# Patient Record
Sex: Female | Born: 1950 | Race: White | Hispanic: No | Marital: Married | Smoking: Never smoker
Health system: Southern US, Community
[De-identification: ages and names within clinical notes are randomized; demographics above are authoritative.]

## PROBLEM LIST (undated history)

## (undated) DIAGNOSIS — F419 Anxiety disorder, unspecified: Secondary | ICD-10-CM

## (undated) DIAGNOSIS — M1990 Unspecified osteoarthritis, unspecified site: Secondary | ICD-10-CM

## (undated) DIAGNOSIS — T8859XA Other complications of anesthesia, initial encounter: Secondary | ICD-10-CM

## (undated) HISTORY — PX: COLONOSCOPY: SHX174

## (undated) HISTORY — PX: CARPAL TUNNEL RELEASE: SHX101

---

## 2002-08-18 ENCOUNTER — Ambulatory Visit (HOSPITAL_COMMUNITY): Admission: RE | Admit: 2002-08-18 | Discharge: 2002-08-18 | Payer: Self-pay | Admitting: Otolaryngology

## 2002-08-18 ENCOUNTER — Encounter: Payer: Self-pay | Admitting: Otolaryngology

## 2004-05-30 ENCOUNTER — Ambulatory Visit (HOSPITAL_COMMUNITY): Admission: RE | Admit: 2004-05-30 | Discharge: 2004-05-30 | Payer: Self-pay | Admitting: General Surgery

## 2004-05-30 ENCOUNTER — Encounter (INDEPENDENT_AMBULATORY_CARE_PROVIDER_SITE_OTHER): Payer: Self-pay | Admitting: *Deleted

## 2004-05-30 ENCOUNTER — Ambulatory Visit (HOSPITAL_BASED_OUTPATIENT_CLINIC_OR_DEPARTMENT_OTHER): Admission: RE | Admit: 2004-05-30 | Discharge: 2004-05-30 | Payer: Self-pay | Admitting: General Surgery

## 2006-06-29 HISTORY — PX: THYROID SURGERY: SHX805

## 2006-11-05 ENCOUNTER — Ambulatory Visit (HOSPITAL_COMMUNITY): Admission: RE | Admit: 2006-11-05 | Discharge: 2006-11-05 | Payer: Self-pay | Admitting: Otolaryngology

## 2006-11-25 ENCOUNTER — Other Ambulatory Visit: Admission: RE | Admit: 2006-11-25 | Discharge: 2006-11-25 | Payer: Self-pay | Admitting: Otolaryngology

## 2007-01-07 ENCOUNTER — Encounter (INDEPENDENT_AMBULATORY_CARE_PROVIDER_SITE_OTHER): Payer: Self-pay | Admitting: Otolaryngology

## 2007-01-07 ENCOUNTER — Ambulatory Visit (HOSPITAL_COMMUNITY): Admission: RE | Admit: 2007-01-07 | Discharge: 2007-01-08 | Payer: Self-pay | Admitting: Otolaryngology

## 2008-06-18 ENCOUNTER — Ambulatory Visit (HOSPITAL_COMMUNITY): Admission: RE | Admit: 2008-06-18 | Discharge: 2008-06-18 | Payer: Self-pay | Admitting: Otolaryngology

## 2010-11-11 NOTE — Op Note (Signed)
NAME:  Dana Owens, Dana Owens               ACCOUNT NO.:  [id number]   MEDICAL RECORD NO.:  [id number]          PATIENT TYPE:  OIB   LOCATION:  5733                         FACILITY:  [facility]   PHYSICIAN:  Jefry H. Pollyann Kennedy, MD     DATE OF BIRTH:  [birth year]   DATE OF PROCEDURE:  01/07/2007  DATE OF DISCHARGE:                               OPERATIVE REPORT   PREOPERATIVE DIAGNOSIS:  Left thyroid mass.   POSTOPERATIVE DIAGNOSIS:  Left thyroid mass.   PROCEDURE:  Left thyroid lobectomy.   SURGEON:  Jefry H. Pollyann Kennedy, MD   ASSISTANT:  Kinnie Scales. Annalee Genta, M.D.   General endotracheal anesthesia was used.   No complications.   No blood loss.   FINDINGS:  Diffusely firm and somewhat sticky thyroid gland with  multiple nodules.  The frozen section evaluation revealed no evidence of  carcinoma.   HISTORY:  This is a 60 year old lady who has a history of thyroiditis  and has had a thyroid nodule on the left side that has increased in size  over the past couple of years.  Risks, benefits, alternatives,  complications of the procedure were explained to the patient who seemed  to understand and agreed for surgery.   OPERATIVE PROCEDURE:  The patient was taken to the operating room and  placed on the operating room table in the supine position.  Following  induction of general endotracheal anesthesia, the neck was prepped and  draped in a standard fashion.  A low collar incision was outlined with a  marking pen.  Electrocautery was used to incise through the skin,  subcutaneous tissue and superficial fascia.  A subplatysmal flap was  developed superiorly to the thyroid notch and inferiorly to the  clavicle.  Self-retaining thyroid retractor was used.  The midline  fascia was divided and the strap muscles were reflected laterally off  the left lobe of the thyroid.  The dissection was continued along the  capsule of the thyroid.  Parathyroid glands were not separately  identified.  The recurrent nerve  was identified and was preserved.  The  gland was somewhat adherent to all surrounding tissues.  The superior  and inferior vasculature was ligated between clamps and divided.  Berry  ligament was divided using electrocautery.  The thyroid isthmus was then  divided with cautery as well.  The specimen was sent for pathologic  evaluation.  The surgical bed was irrigated with saline and hemostasis  was completed using silk ties and bipolar cautery as needed.  The wound  was closed in layers using 3-0 chromic in the midline fascia in the  platysma and the subcuticular layer.  Dermabond was used on the skin.  A 7-French round drain was left in the  wound, navigated through the right side of the incision and secured in  place with a nylon suture.  The patient was then awakened, extubated and  transferred to recovery in stable condition.      Jefry H. Pollyann Kennedy, MD  Electronically Signed     JHR/MEDQ  D:  01/07/2007  T:  01/07/2007  Job:  [id number]

## 2010-11-14 NOTE — Op Note (Signed)
NAMEMELICIA, ESQUEDA               ACCOUNT NO.:  [id number]   MEDICAL RECORD NO.:  [id number]          PATIENT TYPE:  AMB   LOCATION:  DSC                          FACILITY:  [facility]   PHYSICIAN:  Adolph Pollack, M.D.DATE OF BIRTH:  May 01, 1951   DATE OF PROCEDURE:  05/30/2004  DATE OF DISCHARGE:                                 OPERATIVE REPORT   PREOPERATIVE DIAGNOSIS:  Inflamed epidermoid cyst to right shoulder.   POSTOPERATIVE DIAGNOSIS:  Inflamed epidermoid cyst to right shoulder.   PROCEDURE:  Excision of 2-cm inflamed epidermoid cyst right shoulder with  primary closure of wound.   SURGEON:  Adolph Pollack, M.D.   ANESTHESIA:  Local (1% lidocaine plus 0.5% Marcaine).   INDICATION:  This is a 60 year old female with an infected epidermoid cyst.  She had drainage and debridement in my office.  She has had some residual  cyst material come back and it appears to be inflamed.  She now presents for  excision.   TECHNIQUE:  She is placed supine on the table in the minor procedure room.  The right anterior shoulder was examined and the area marked with marking  pen to be excised.  It was then sterilely prepped and draped.  Local  anesthetic was infiltrated superficially and deep with an elliptical  fashion.  An elliptical incision was made full-thickness skin and including  some subcutaneous tissue down to soft tissue, then the mass was excised  sharply and sent to pathology.   Bleeding was controlled with cautery.  Once hemostasis was adequate, the  wound was closed primarily with interrupted simple 3-0 Nylon sutures.  Neosporin and a dressing were applied.   She tolerated the procedure well without any apparent complications.  She  was released from the minor procedure room in satisfactory condition.  She  was given discharge instructions and will come back and see me in 10-14 days  or sooner if needed.      Tish Men  D:  05/30/2004  T:  05/30/2004  Job:   [id number]

## 2011-02-06 ENCOUNTER — Other Ambulatory Visit: Payer: Self-pay | Admitting: Obstetrics & Gynecology

## 2011-04-14 LAB — BASIC METABOLIC PANEL
BUN: 11
CO2: 29
Calcium: 9.5
Chloride: 104
Creatinine, Ser: 0.78
GFR calc Af Amer: >60
GFR calc non Af Amer: >60
Glucose, Bld: 93
Potassium: 4.4
Sodium: 140

## 2011-04-14 LAB — CBC
RBC: 4.5
WBC: 5.9

## 2011-12-08 ENCOUNTER — Other Ambulatory Visit (HOSPITAL_COMMUNITY): Payer: Self-pay | Admitting: Preventative Medicine

## 2011-12-08 DIAGNOSIS — R9389 Abnormal findings on diagnostic imaging of other specified body structures: Secondary | ICD-10-CM

## 2011-12-11 ENCOUNTER — Ambulatory Visit (HOSPITAL_COMMUNITY)
Admission: RE | Admit: 2011-12-11 | Discharge: 2011-12-11 | Disposition: A | Discharge status: Home / Self Care | Payer: BC Managed Care – PPO | Source: Ambulatory Visit | Attending: Preventative Medicine | Admitting: Preventative Medicine

## 2011-12-11 DIAGNOSIS — R918 Other nonspecific abnormal finding of lung field: Secondary | ICD-10-CM | POA: Insufficient documentation

## 2011-12-11 DIAGNOSIS — R9389 Abnormal findings on diagnostic imaging of other specified body structures: Secondary | ICD-10-CM

## 2011-12-11 DIAGNOSIS — R05 Cough: Secondary | ICD-10-CM | POA: Insufficient documentation

## 2011-12-11 DIAGNOSIS — R059 Cough, unspecified: Secondary | ICD-10-CM | POA: Insufficient documentation

## 2013-05-08 IMAGING — CT CT CHEST W/O CM
2 of 3 series · 15 of 36 positions shown, 18 images · non-contrast
Comparison: Chest radiograph 12/07/2011

CLINICAL DATA: Cough, abnormal chest x-ray, history of thyroid
surgery

CT CHEST WITHOUT CONTRAST
TECHNIQUE: Multidetector CT imaging of the chest was performed
following the standard protocol without IV contrast. Sagittal and
coronal MPR images reconstructed from axial data set.

[Series 2: chestroutine 5.0 b40f · axial · 0.66mm/px · z∈[-275,-25]mm · 12 of 60 slices shown, 15 images]
[im 5/60  mediastinal]
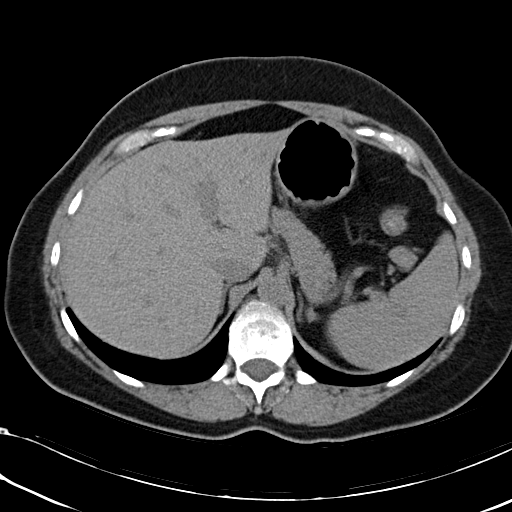
[im 5/60  lung]
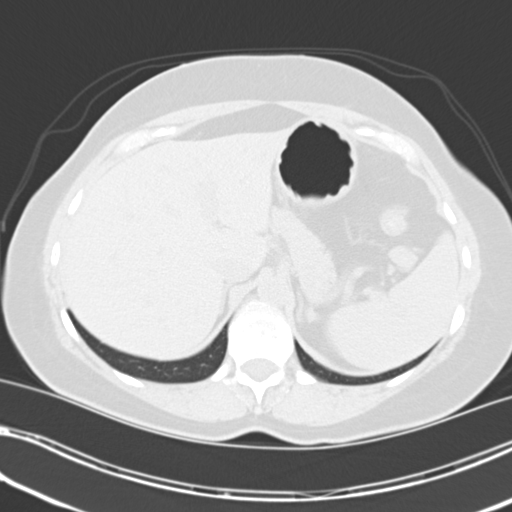
[im 9/60  lung]
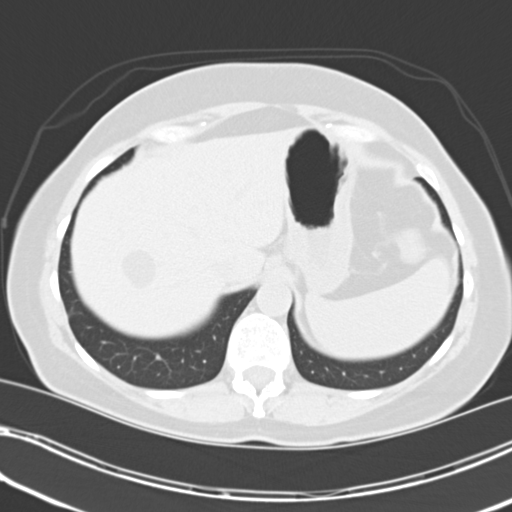
[im 14/60  lung]
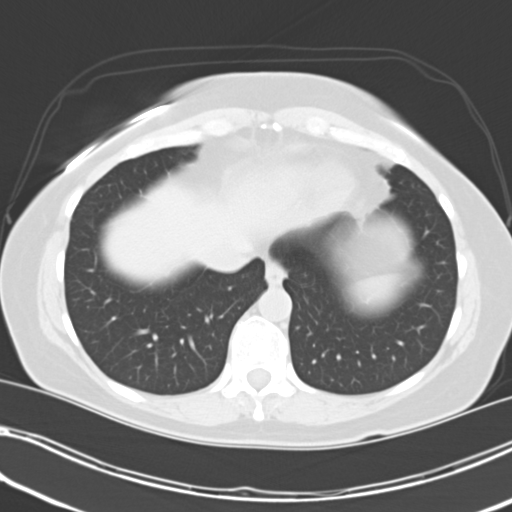
[im 18/60  lung]
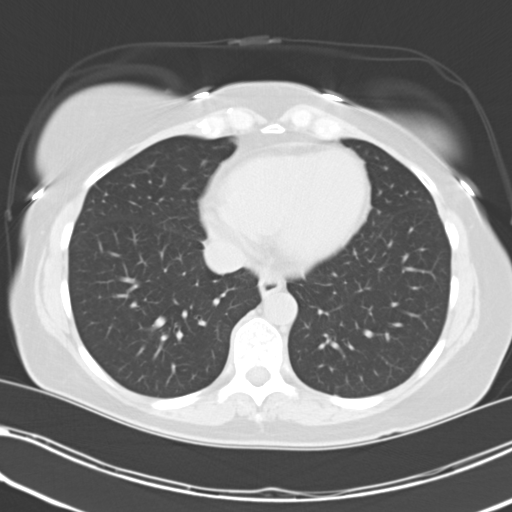
[im 22/60  mediastinal]
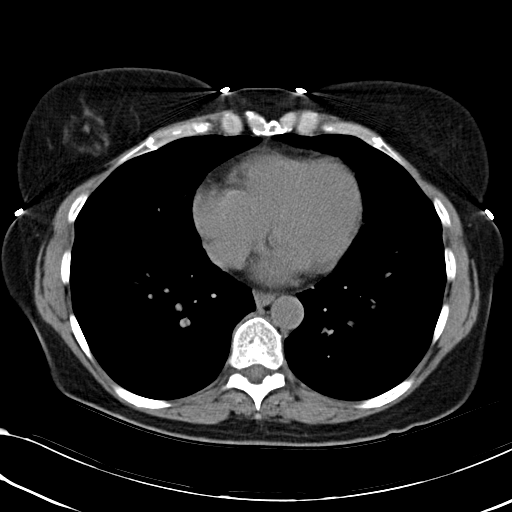
[im 22/60  lung]
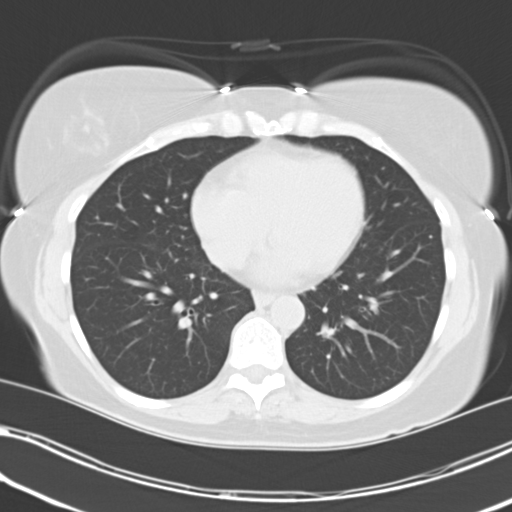
[im 27/60  lung]
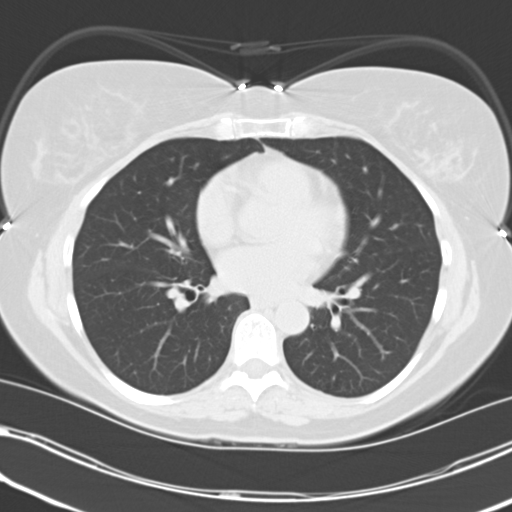
[im 33/60  lung]
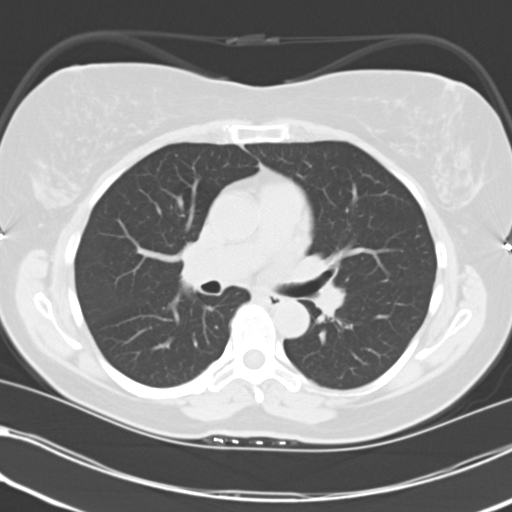
[im 38/60  lung]
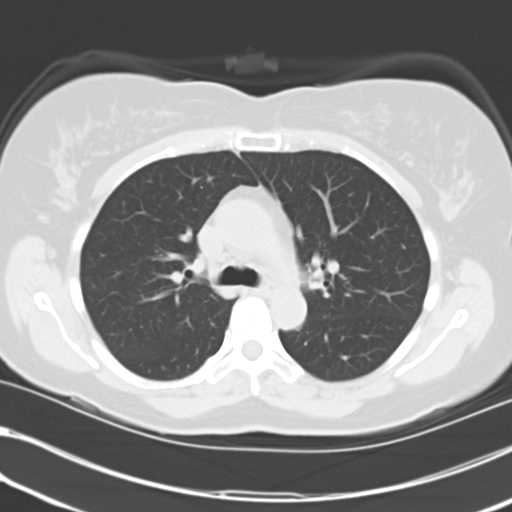
[im 42/60  mediastinal]
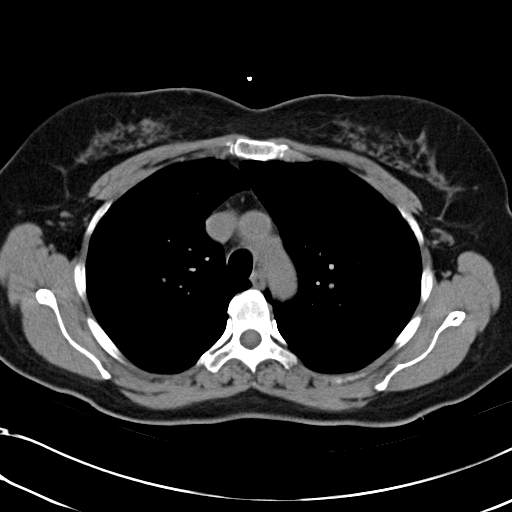
[im 42/60  lung]
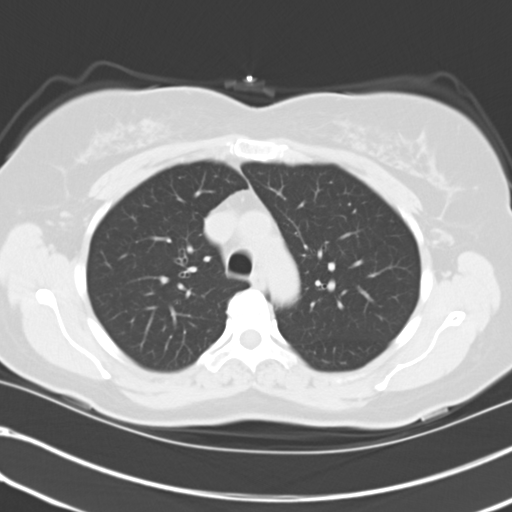
[im 46/60  lung]
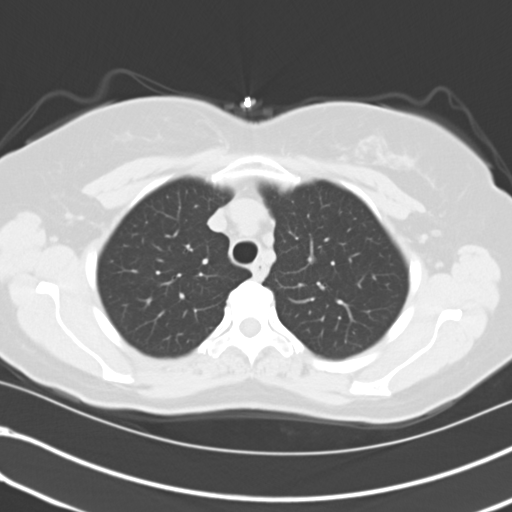
[im 51/60  lung]
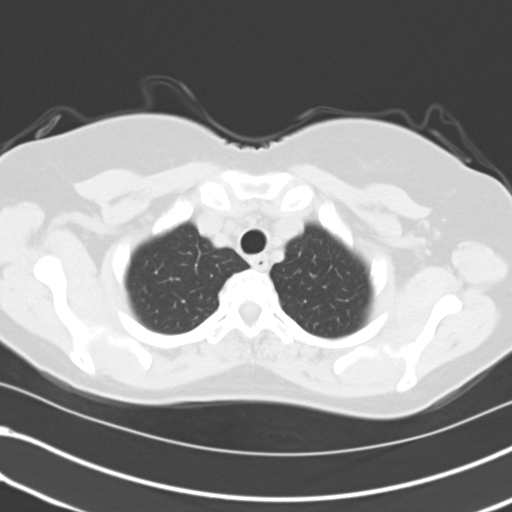
[im 55/60  lung]
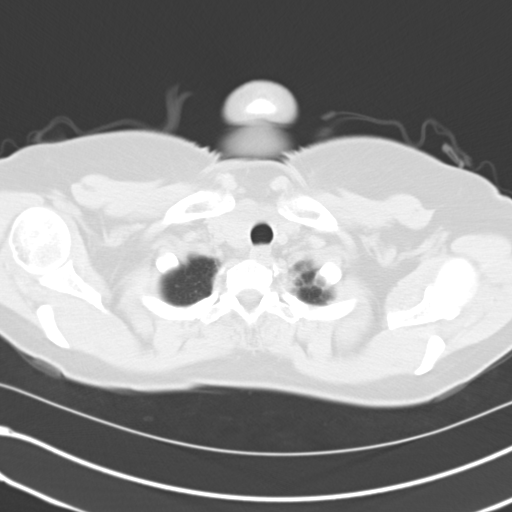

[Series 4: mpr coro 3mm · coronal · 0.60mm/px · 3 of 78 slices shown]
[im 16/78  lung]
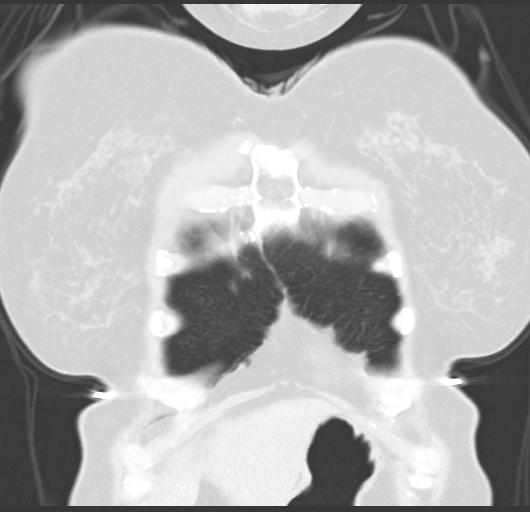
[im 31/78  lung]
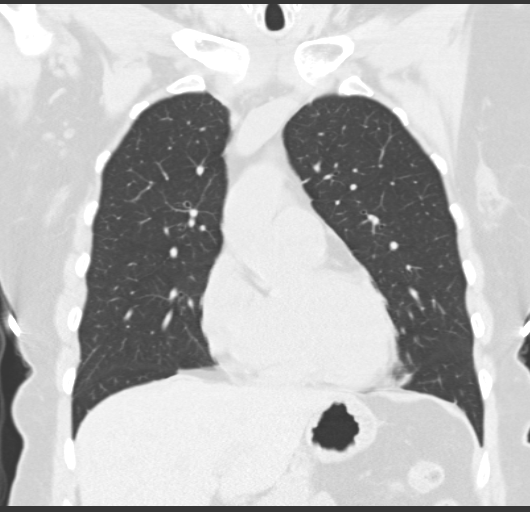
[im 47/78  lung]
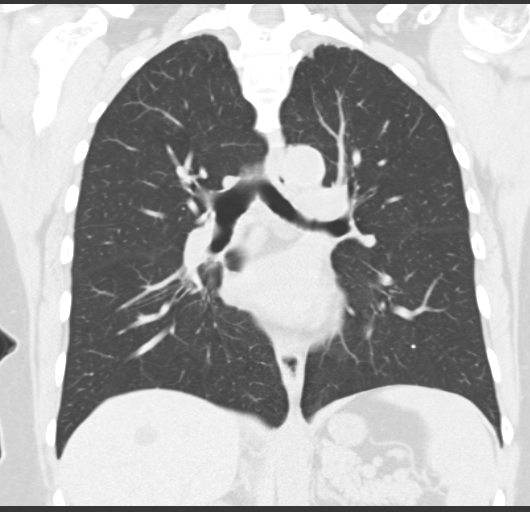

[15 of 36 positions shown; findings below may reference images not displayed]

FINDINGS: Aorta normal caliber.
No thoracic adenopathy.
Hepatic cysts, largest 2.7 x 2.3 cm.
Remaining visualized portions of upper abdomen normal appearance.
Mildly prominent epicardial fat pad at left cardiophrenic angle /
cardiac apex, accounting for chest radiograph finding.
Tiny right upper lobe nodules images 24-25.
Calcified granulomata left lung.
No dominant mass or nodule.
No infiltrate, pleural effusion or pneumothorax.
Irregular vertebral endplates throughout the thoracic spine with a
few scattered nonspecific vertebral sclerotic foci, could represent
small bone islands.
IMPRESSION: Multiple calcified granulomata in left lung with additional tiny
nodular foci in right lung which are not definitely calcified,
recommendations below.
Prominent fat pad adjacent to the cardiac apex accounting for the
radiographic abnormality.
No evidence of dominant pulmonary mass or nodule.

If the patient is at high risk for bronchogenic carcinoma, follow-
up chest CT at 1 year is recommended.  If the patient is at low
risk, no follow-up is needed.  This recommendation follows the
consensus statement: Guidelines for Management of Small Pulmonary
Nodules Detected on CT Scans:  A Statement from the [employer]

## 2013-06-12 ENCOUNTER — Other Ambulatory Visit: Payer: Self-pay | Admitting: Obstetrics & Gynecology

## 2014-11-05 ENCOUNTER — Other Ambulatory Visit (HOSPITAL_COMMUNITY): Payer: Self-pay | Admitting: Obstetrics & Gynecology

## 2014-11-07 LAB — CYTOLOGY - PAP

## 2016-07-06 ENCOUNTER — Other Ambulatory Visit: Payer: Self-pay | Admitting: Obstetrics & Gynecology

## 2016-07-08 LAB — CYTOLOGY - PAP

## 2017-09-22 ENCOUNTER — Other Ambulatory Visit: Payer: Self-pay | Admitting: Obstetrics & Gynecology

## 2017-09-22 DIAGNOSIS — R928 Other abnormal and inconclusive findings on diagnostic imaging of breast: Secondary | ICD-10-CM

## 2017-09-27 ENCOUNTER — Ambulatory Visit: Payer: Self-pay

## 2017-09-27 ENCOUNTER — Ambulatory Visit
Admission: RE | Admit: 2017-09-27 | Discharge: 2017-09-27 | Disposition: A | Discharge status: Home / Self Care | Payer: Medicare Other | Source: Ambulatory Visit | Attending: Obstetrics & Gynecology | Admitting: Obstetrics & Gynecology

## 2017-09-27 DIAGNOSIS — R928 Other abnormal and inconclusive findings on diagnostic imaging of breast: Secondary | ICD-10-CM

## 2019-08-22 ENCOUNTER — Other Ambulatory Visit: Payer: Self-pay | Admitting: Otolaryngology

## 2019-08-22 DIAGNOSIS — H6121 Impacted cerumen, right ear: Secondary | ICD-10-CM

## 2019-08-22 DIAGNOSIS — E041 Nontoxic single thyroid nodule: Secondary | ICD-10-CM

## 2019-09-01 ENCOUNTER — Other Ambulatory Visit: Payer: Medicare Other

## 2019-09-05 ENCOUNTER — Ambulatory Visit
Admission: RE | Admit: 2019-09-05 | Discharge: 2019-09-05 | Disposition: A | Discharge status: Home / Self Care | Payer: Medicare PPO | Source: Ambulatory Visit | Attending: Otolaryngology | Admitting: Otolaryngology

## 2019-09-05 DIAGNOSIS — H6121 Impacted cerumen, right ear: Secondary | ICD-10-CM

## 2019-09-05 DIAGNOSIS — E041 Nontoxic single thyroid nodule: Secondary | ICD-10-CM

## 2019-09-13 ENCOUNTER — Other Ambulatory Visit: Payer: Self-pay | Admitting: Otolaryngology

## 2019-09-13 DIAGNOSIS — E041 Nontoxic single thyroid nodule: Secondary | ICD-10-CM

## 2019-09-14 ENCOUNTER — Ambulatory Visit
Admission: RE | Admit: 2019-09-14 | Discharge: 2019-09-14 | Disposition: A | Discharge status: Home / Self Care | Payer: Medicare PPO | Source: Ambulatory Visit | Attending: Otolaryngology | Admitting: Otolaryngology

## 2019-09-14 ENCOUNTER — Other Ambulatory Visit: Payer: Self-pay | Admitting: Otolaryngology

## 2019-09-14 DIAGNOSIS — E041 Nontoxic single thyroid nodule: Secondary | ICD-10-CM

## 2020-05-28 ENCOUNTER — Other Ambulatory Visit (HOSPITAL_COMMUNITY): Payer: Self-pay | Admitting: Family

## 2020-05-28 ENCOUNTER — Telehealth (HOSPITAL_COMMUNITY): Payer: Self-pay

## 2020-05-28 DIAGNOSIS — U071 COVID-19: Secondary | ICD-10-CM

## 2020-05-28 NOTE — Progress Notes (Signed)
I connected by phone with Dana Owens on 05/28/2020 at 6:27 PM to discuss the potential use of a new treatment for mild to moderate COVID-19 viral infection in non-hospitalized patients.  This patient is a 69 y.o. female that meets the FDA criteria for Emergency Use Authorization of COVID monoclonal antibody casirivimab/imdevimab, bamlanivimab/eteseviamb, or sotrovimab.  Has a (+) direct SARS-CoV-2 viral test result  Has mild or moderate COVID-19   Is NOT hospitalized due to COVID-19  Is within 10 days of symptom onset  Has at least one of the high risk factor(s) for progression to severe COVID-19 and/or hospitalization as defined in EUA.  Specific high risk criteria : Older age (>/= 71 yo)   Symptoms of cough, congestion, fatigue, H/A began 05/24/20.   I have spoken and communicated the following to the patient or parent/caregiver regarding COVID monoclonal antibody treatment:  1. FDA has authorized the emergency use for the treatment of mild to moderate COVID-19 in adults and pediatric patients with positive results of direct SARS-CoV-2 viral testing who are 52 years of age and older weighing at least 40 kg, and who are at high risk for progressing to severe COVID-19 and/or hospitalization.  2. The significant known and potential risks and benefits of COVID monoclonal antibody, and the extent to which such potential risks and benefits are unknown.  3. Information on available alternative treatments and the risks and benefits of those alternatives, including clinical trials.  4. Patients treated with COVID monoclonal antibody should continue to self-isolate and use infection control measures (e.g., wear mask, isolate, social distance, avoid sharing personal items, clean and disinfect "high touch" surfaces, and frequent handwashing) according to CDC guidelines.   5. The patient or parent/caregiver has the option to accept or refuse COVID monoclonal antibody treatment.  After  reviewing this information with the patient, the patient has agreed to receive one of the available covid 19 monoclonal antibodies and will be provided an appropriate fact sheet prior to infusion. [name], NP 05/28/2020 6:27 PM

## 2020-05-28 NOTE — Telephone Encounter (Signed)
Patient's husband Luvenia Cranford called the COVID infusion clinic to get an appointment for the monoclonal antibody infusion for those with mild to moderate Covid symptoms and at a high risk of hospitalization.     Pt appears to qualify for this infusion due to co-morbid conditions and/or a member of an at-risk group in accordance with the FDA Emergency Use Authorization.    Pt's husband stated his wife's symptoms started on 11/26, tested positive on the 29th in [place] and states symptoms include cough, congestion, and fatigue. Pt would qualify based on age for treatment. Pt has not been vaccinated. RN informed pt's husband Mr. Ytuarte that an APP would be calling to verify information and if she qualifies will set both patient and himself up for an appointment.

## 2020-05-29 ENCOUNTER — Ambulatory Visit (HOSPITAL_COMMUNITY)
Admission: RE | Admit: 2020-05-29 | Discharge: 2020-05-29 | Disposition: A | Discharge status: Home / Self Care | Payer: Medicare PPO | Source: Ambulatory Visit | Attending: Pulmonary Disease | Admitting: Pulmonary Disease

## 2020-05-29 ENCOUNTER — Other Ambulatory Visit (HOSPITAL_COMMUNITY): Payer: Self-pay

## 2020-05-29 DIAGNOSIS — U071 COVID-19: Secondary | ICD-10-CM | POA: Diagnosis present

## 2020-05-29 DIAGNOSIS — Z23 Encounter for immunization: Secondary | ICD-10-CM | POA: Diagnosis not present

## 2020-05-29 MED ORDER — SODIUM CHLORIDE 0.9 % IV SOLN
500.0000 mg | Freq: Once | INTRAVENOUS | Status: AC
  Administered 2020-05-29: 500 mg via INTRAVENOUS

## 2020-05-29 MED ORDER — METHYLPREDNISOLONE SODIUM SUCC 125 MG IJ SOLR: 125.0000 mg | Freq: Once | INTRAMUSCULAR | Status: DC | PRN

## 2020-05-29 MED ORDER — FAMOTIDINE IN NACL 20-0.9 MG/50ML-% IV SOLN: 20.0000 mg | Freq: Once | INTRAVENOUS | Status: DC | PRN

## 2020-05-29 MED ORDER — DIPHENHYDRAMINE HCL 50 MG/ML IJ SOLN: 50.0000 mg | Freq: Once | INTRAMUSCULAR | Status: DC | PRN

## 2020-05-29 MED ORDER — EPINEPHRINE 0.3 MG/0.3ML IJ SOAJ: 0.3000 mg | Freq: Once | INTRAMUSCULAR | Status: DC | PRN

## 2020-05-29 MED ORDER — ALBUTEROL SULFATE HFA 108 (90 BASE) MCG/ACT IN AERS: 2.0000 | INHALATION_SPRAY | Freq: Once | RESPIRATORY_TRACT | Status: DC | PRN

## 2020-05-29 MED ORDER — SODIUM CHLORIDE 0.9 % IV SOLN: INTRAVENOUS | Status: DC | PRN

## 2020-05-29 NOTE — Discharge Instructions (Signed)
10 Things You Can Do to Manage Your COVID-19 Symptoms at Home If you have possible or confirmed COVID-19: 1. Stay home from work and school. And stay away from other public places. If you must go out, avoid using any kind of public transportation, ridesharing, or taxis. 2. Monitor your symptoms carefully. If your symptoms get worse, call your healthcare provider immediately. 3. Get rest and stay hydrated. 4. If you have a medical appointment, call the healthcare provider ahead of time and tell them that you have or may have COVID-19. 5. For medical emergencies, call 911 and notify the dispatch personnel that you have or may have COVID-19. 6. Cover your cough and sneezes with a tissue or use the inside of your elbow. 7. Wash your hands often with soap and water for at least 20 seconds or clean your hands with an alcohol-based hand sanitizer that contains at least 60% alcohol. 8. As much as possible, stay in a specific room and away from other people in your home. Also, you should use a separate bathroom, if available. If you need to be around other people in or outside of the home, wear a mask. 9. Avoid sharing personal items with other people in your household, like dishes, towels, and bedding. 10. Clean all surfaces that are touched often, like counters, tabletops, and doorknobs. Use household cleaning sprays or wipes according to the label instructions. cdc.gov/coronavirus 12/28/2018 This information is not intended to replace advice given to you by your health care provider. Make sure you discuss any questions you have with your health care provider. Document Revised: 06/01/2019 Document Reviewed: 06/01/2019 Elsevier Patient Education  2020 Elsevier Inc. What types of side effects do monoclonal antibody drugs cause?  Common side effects  In general, the more common side effects caused by monoclonal antibody drugs include: . Allergic reactions, such as hives or itching . Flu-like signs and  symptoms, including chills, fatigue, fever, and muscle aches and pains . Nausea, vomiting . Diarrhea . Skin rashes . Low blood pressure   The CDC is recommending patients who receive monoclonal antibody treatments wait at least 90 days before being vaccinated.  Currently, there are no data on the safety and efficacy of mRNA COVID-19 vaccines in persons who received monoclonal antibodies or convalescent plasma as part of COVID-19 treatment. Based on the estimated half-life of such therapies as well as evidence suggesting that reinfection is uncommon in the 90 days after initial infection, vaccination should be deferred for at least 90 days, as a precautionary measure until additional information becomes available, to avoid interference of the antibody treatment with vaccine-induced immune responses. If you have any questions or concerns after the infusion please call the Advanced Practice Provider on call at [phone]. This number is ONLY intended for your use regarding questions or concerns about the infusion post-treatment side-effects.  Please do not provide this number to others for use. For return to work notes please contact your primary care provider.   If someone you know is interested in receiving treatment please have them call the COVID hotline at [phone].   

## 2020-05-29 NOTE — Progress Notes (Signed)
Patient reviewed Fact Sheet for Patients, Parents, and Caregivers for Emergency Use Authorization (EUA) of Sotrovimab for the Treatment of Coronavirus. Patient also reviewed and is agreeable to the estimated cost of treatment. Patient is agreeable to proceed.   

## 2020-05-29 NOTE — Progress Notes (Addendum)
Diagnosis: COVID-19  Physician: Dr. Patrick Wright  Procedure: Covid Infusion Clinic Med: Sotrovimab infusion - Provided patient with sotrovimab fact sheet for patients, parents, and caregivers prior to infusion.   Complications: No immediate complications noted  Discharge: Discharged home    

## 2022-11-02 ENCOUNTER — Ambulatory Visit: Payer: Medicare PPO | Admitting: Allergy & Immunology

## 2022-11-02 ENCOUNTER — Encounter: Payer: Self-pay | Admitting: Allergy & Immunology

## 2022-11-02 VITALS — BP 118/64 | HR 97 | Temp 97.9°F | Resp 18 | Ht 64.25 in | Wt 188.0 lb

## 2022-11-02 DIAGNOSIS — T781XXA Other adverse food reactions, not elsewhere classified, initial encounter: Secondary | ICD-10-CM | POA: Diagnosis not present

## 2022-11-02 DIAGNOSIS — E039 Hypothyroidism, unspecified: Secondary | ICD-10-CM

## 2022-11-02 MED ORDER — HYDROXYZINE HCL 25 MG PO TABS: 25.0000 mg | ORAL_TABLET | Freq: Three times a day (TID) | ORAL | 5 refills | Status: DC | PRN

## 2022-11-02 MED ORDER — EPINEPHRINE 0.3 MG/0.3ML IJ SOAJ: 0.3000 mg | INTRAMUSCULAR | 2 refills | Status: DC | PRN

## 2022-11-02 NOTE — Progress Notes (Signed)
NEW PATIENT  Date of Service/Encounter:  11/02/22  Consult requested by: Patient, No Pcp Per   Assessment:   Allergic reaction to alpha-gal  Hypothyroidism - on levothyroxine  Anxiety - on Zoloft 50mg  daily  Plan/Recommendations:   1. 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 - EpiPen training provided.  - Information on alpha gal provided.  - We will see where the levels are trending.  - You will probably see people claiming to cure this with acupuncture, but there is no mechanistic way that this could work.  - I have had many patients that come back from this treatment and consume red meat and have anaphylaxis.  - These people who do get "cured" from this are likely those who would have lost the sensitization anyway.   2. Return in about 6 months (around 05/05/2023). You can have the follow up appointment with Dr. Dellis Anes or a Nurse Practicioner (our Nurse Practitioners are excellent and always have Physician oversight!).    This note in its entirety was forwarded to the Provider who requested this consultation.  Subjective:   Dana Owens is a 72 y.o. female presenting today for evaluation of  Chief Complaint  Patient presents with   Allergic Reaction    Wants to recheck alpha gal labs as she and her family believe it was wrong. States she only reacted twice, one includes itching and hives.     Dana Owens has a history of the following: Patient Active Problem List   Diagnosis Date Noted   Allergic reaction to alpha-gal 11/02/2022    History obtained from: chart review and patient.  Dana Owens was referred by Patient, No Pcp Per.     Dana Owens is a 72 y.o. female presenting for an evaluation of alpha gal .  She was presenting with itching. Her son has this and gets hives. She had this done by [facility]. These two occasions were the night after eating beef. She did have some garlic mashed potatoes as  well, but she has had potatoes and garlic since that time. She had Lyme testing done that was negative. She never got an EpiPen. She is going to the beach with her sister this week. She did get prednisone for 5 days and this worked.  Her itching is now not an issue at all. She also got some hydroxyzine and would like a refill of that in case she needs it.   She had testing done through [facility] that was positive.  Her beef IgE was 25.7.  Her lamb IgE was 23.7.  Her pork IgE was 19.2.  Her alpha gal IgE was 34.3. this was within the last year or so.   She does remember a tick bite. She got one off three weeks before the first reaction and then four weeks before the next visit. She does have itching after getting the first bite.   She is now avoiding red meats. She did have roast beef from [employer] since then without a problem. She is better about avoiding pork.    She has a number of acres and spends time outdoors. Ticks were living in the taller grass on their property.   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 She also worked as the [employer].   Otherwise, there is no history of  other atopic diseases, including asthma, drug allergies, environmental allergies, stinging insect allergies, eczema, urticaria, or contact dermatitis. There is no significant infectious history. Vaccinations are up to date.    Past Medical History: Patient Active Problem List   Diagnosis Date Noted   Allergic reaction to alpha-gal 11/02/2022    Medication List:  Allergies as of 11/02/2022       Reactions   Alpha-gal Hives, Itching        Medication List        Accurate as of Nov 02, 2022  3:57 PM. If you have any questions, ask your nurse or doctor.          EPINEPHrine 0.3 mg/0.3 mL Soaj injection Commonly known as: EpiPen 2-Pak Inject 0.3 mg into the muscle as needed for  anaphylaxis. Started by: Alfonse Spruce, MD   hydrOXYzine 25 MG tablet Commonly known as: ATARAX Take 1 tablet (25 mg total) by mouth 3 (three) times daily as needed. Started by: Alfonse Spruce, MD   levothyroxine 75 MCG tablet Commonly known as: SYNTHROID Take 75 mcg by mouth.   Refresh Liquigel 1 % Gel Generic drug: Carboxymethylcellulose Sodium Apply 1 drop to eye as needed.   sertraline 50 MG tablet Commonly known as: ZOLOFT Take 50 mg by mouth daily.        Birth History: non-contributory  Developmental History:  non-contributory  Past Surgical History: Past Surgical History:  Procedure Laterality Date   THYROID SURGERY  2008     Family History: Family History  Problem Relation Age of Onset   Allergic Disorder Sister    GER disease Sister      Social History: Dana Owens lives at home with her family. She has been retired since 2005.  They live in a house that is around 74 years old.  There is hardwood, laminate, tile, and carpeting throughout the home.  They have a heat pump for heating and cooling.  There is 1 multi food dog in the home.  There are no dust mite covers on the bedding.  There is no tobacco exposure.  She is retired.  She is not exposed to fumes, chemicals, or dust.  There is a HEPA filter in the home.  They do not live near an interstate or industrial area. They have five children and 12 grand-children.    Review of Systems  Constitutional: Negative.  Negative for fever, malaise/fatigue and weight loss.  HENT: Negative.  Negative for congestion, ear discharge and ear pain.   Eyes:  Negative for pain, discharge and redness.  Respiratory:  Negative for cough, sputum production, shortness of breath and wheezing.   Cardiovascular: Negative.  Negative for chest pain and palpitations.  Gastrointestinal:  Negative for abdominal pain, diarrhea, heartburn, nausea and vomiting.  Skin: Negative.  Negative for itching and rash.  Neurological:   Negative for dizziness and headaches.  Endo/Heme/Allergies:  Negative for environmental allergies. Does not bruise/bleed easily.       Objective:   Blood pressure 118/64, pulse 97, temperature 97.9 F (36.6 C), temperature source Temporal, resp. rate 18, height 5' 4.25" (1.632 m), weight 188 lb (85.3 kg), SpO2 95 %. Body mass index is 32.02 kg/m.     Physical Exam Vitals reviewed.  Constitutional:      Appearance: She is well-developed.     Comments: Friendly. Cooperative with the exam.   HENT:     Head: Normocephalic and atraumatic.     Right Ear: Tympanic membrane, ear canal and external  ear normal. No drainage, swelling or tenderness. Tympanic membrane is not injected, scarred, erythematous, retracted or bulging.     Left Ear: Tympanic membrane, ear canal and external ear normal. No drainage, swelling or tenderness. Tympanic membrane is not injected, scarred, erythematous, retracted or bulging.     Nose: No nasal deformity, septal deviation, mucosal edema or rhinorrhea.     Right Turbinates: Enlarged, swollen and pale.     Left Turbinates: Enlarged, swollen and pale.     Right Sinus: No maxillary sinus tenderness or frontal sinus tenderness.     Left Sinus: No maxillary sinus tenderness or frontal sinus tenderness.     Mouth/Throat:     Mouth: Mucous membranes are not pale and not dry.     Pharynx: Uvula midline.  Eyes:     General:        Right eye: No discharge.        Left eye: No discharge.     Conjunctiva/sclera: Conjunctivae normal.     Right eye: Right conjunctiva is not injected. No chemosis.    Left eye: Left conjunctiva is not injected. No chemosis.    Pupils: Pupils are equal, round, and reactive to light.  Cardiovascular:     Rate and Rhythm: Normal rate and regular rhythm.     Heart sounds: Normal heart sounds.  Pulmonary:     Effort: Pulmonary effort is normal. No tachypnea, accessory muscle usage or respiratory distress.     Breath sounds: Normal breath  sounds. No wheezing, rhonchi or rales.  Chest:     Chest wall: No tenderness.  Abdominal:     Tenderness: There is no abdominal tenderness. There is no guarding or rebound.  Lymphadenopathy:     Head:     Right side of head: No submandibular, tonsillar or occipital adenopathy.     Left side of head: No submandibular, tonsillar or occipital adenopathy.     Cervical: No cervical adenopathy.  Skin:    Coloration: Skin is not pale.     Findings: No abrasion, erythema, petechiae or rash. Rash is not papular, urticarial or vesicular.  Neurological:     Mental Status: She is alert.  Psychiatric:        Behavior: Behavior is cooperative.      Diagnostic studies: none (repeat alpha gal panel ordered)       Malachi Bonds, MD [facility]

## 2022-11-02 NOTE — Patient Instructions (Addendum)
1. 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 - EpiPen training provided.  - Information on alpha gal provided.  - We will see where the levels are trending.  - You will probably see people claiming to cure this with acupuncture, but there is no mechanistic way that this could work.  - I have had many patients that come back from this treatment and consume red meat and have anaphylaxis.  - These people who do get "cured" from this are likely those who would have lost the sensitization anyway.   2. Return in about 6 months (around 05/05/2023). You can have the follow up appointment with Dr. Dellis Anes or a Nurse Practicioner (our Nurse Practitioners are excellent and always have Physician oversight!).    Please inform us of any Emergency Department visits, hospitalizations, or changes in symptoms. Call us before going to the ED for breathing or allergy symptoms since we might be able to fit you in for a sick visit. Feel free to contact us anytime with any questions, problems, or concerns.  It was a pleasure to meet you today!  Websites that have reliable patient information: 1. American Academy of Asthma, Allergy, and Immunology: www.aaaai.org 2. Food Allergy Research and Education (FARE): foodallergy.org 3. Mothers of Asthmatics: http://www.asthmacommunitynetwork.org 4. American College of Allergy, Asthma, and Immunology: www.acaai.org   COVID-19 Vaccine Information can be found at: [email or web address] For questions related to vaccine distribution or appointments, please email [email or web address] or call [phone].   We realize that you might be concerned about having an allergic reaction to the COVID19 vaccines. To help with that concern, WE ARE OFFERING THE COVID19 VACCINES IN OUR OFFICE! Ask the front desk for dates!     "Like" [place] on Facebook and Instagram for our latest updates!       A healthy democracy works best when [employer] participate! Make sure you are registered to vote! If you have moved or changed any of your contact information, you will need to get this updated before voting!  In some cases, you MAY be able to register to vote online: [email or web address]      Alpha-gal and Red Meat Allergy   Overview An allergy to "alpha-gal" refers to having a severe and potentially life-threatening allergy to a carbohydrate molecule called galactose-alpha-1,3-galactose that is found in most mammalian or "red meat". Unlike other food allergies which typically occur within minutes of ingestion, symptoms from eating red meat such as pork, lamb or beef may be delayed, occurring 3-8 hours after eating. Most food allergies are directed against a protein molecule, but alpha-gal is unusual because it is a carbohydrate, and a delay in its absorption may explain the delay in symptoms.  What are the symptoms of an alpha-gal allergy? As with other food allergies, signs or symptoms of an allergy to alpha-gal may include: Hives and itching  Swelling of your lips, face or eyelids  Shortness of breath, cough or wheezing  Abdominal pain, nausea, diarrhea or vomiting The most severe reaction, anaphylaxis, can present as a combination of several of these symptoms, may include low blood pressure, and is potentially fatal.  Because these symptoms are delayed, you may only wake up with them in the middle of the night after an evening meal.  How is an alpha-gal allergy diagnosed? Diagnosis of this allergy starts with your allergist taking an appropriate history and physical examination. Because the onset is usually quite delayed, it can be hard to associate the symptoms with eating  red meat many hours previously. Triggers include any red meat - including beef, pork, lamb or even horse products. It may occur after eating hotdogs and hamburgers. In very rare cases  the reaction may extend to milk or dairy proteins and gelatin.  Your allergist may recommend testing that includes skin tests to the relevant animal proteins and blood tests which measure the levels of a specific immunoglobulin E (IgE) antibody, to mammalian meats. An investigational blood test, IgE against alpha-gal itself, may also aid in the diagnosis.  How is an alpha-gal allergy treated? Immediate symptoms such as hives or shortness of breath are treated the same as any other food allergy - in an urgent care setting with anti-histamines, epinephrine and other medications. Prevention long-term involves avoidance of all red meat in sensitized individuals. You may be advised to carry an epinephrine auto-injector, to be used in case of subsequent accidental exposures and reaction. These measures do not necessarily mean switching to a full vegetarian diet, since poultry and fish can be consumed and do not cause similar reactions. As with other food allergies, there is the possibility that over time the sensitivity diminishes - although these changes may take many years to become apparent.  How do you become allergic to alpha-gal? Alpha-gal is a molecule carried in the saliva of the [place] tick and other potential arthropods typically after feeding on mammalian blood. People that are bitten by the tick, especially those that are bitten repeatedly, are at risk of becoming sensitized and producing the IgE necessary to then cause allergic reactions. Interestingly, allergic reactions may occur to red meat, to subsequent tick bites, and even to medications that contain alpha-gal. Cetuximab is a cancer medication that contains alpha-gal, and people who have had allergic reactions to this medication (these are typically immediate reactions, because it is infused intravenously) have a higher risk for red meat allergy and are likely to have been bitten by ticks in the past. As might be expected, the incidence of  tick bites is much higher in the [place] the traditional habitat for the tick. However, cases are now increasingly reported in the [place] and [place] states. And it is a phenomenon that has been observed worldwide, with different ticks responsible for similar cases of red meat allergy in many other countries such as [place], [place] and [place].  The discovery of this peculiar allergy has allowed researchers to correlate tick bites with many cases of anaphylaxis that would previously have been classified as 'idiopathic', or of unknown cause. Also, while it was originally thought that the [employer] tick had to feast on mammalian blood in order to carry the alpha-gal molecule, more recent research has shown that it may carry this molecule and be capable of sensitizing humans independently.  How do you prevent an alpha-gal allergy? Because this allergy is predominantly tick born, you are more likely at risk if you often go outdoors in wooded areas for activities such as hiking, fishing or hunting. The key strategy is to prevent tick bites. This may include wearing long sleeved shirts or pants, using appropriate insect repellants, and surveying for ticks after spending time outdoors. Any observed ticks should be removed carefully by cleaning the site with rubbing alcohol, then using tweezers to pull the tick's head up carefully from the skin using steady pressure. Clean your hands and the site one more time and make sure not to crush the tick between your fingers.

## 2022-11-04 LAB — TRYPTASE: Tryptase: 9.1 ug/L (ref 2.2–13.2)

## 2022-11-05 LAB — ALPHA-GAL PANEL
Allergen Lamb IgE: 17.1 kU/L — AB
Beef IgE: 18.9 kU/L — AB
IgE (Immunoglobulin E), Serum: 87 IU/mL (ref 6–495)
O215-IgE Alpha-Gal: 25.5 kU/L — AB
Pork IgE: 15.9 kU/L — AB

## 2022-11-11 ENCOUNTER — Telehealth: Payer: Self-pay | Admitting: *Deleted

## 2022-11-11 NOTE — Telephone Encounter (Signed)
Noted - thanks!   Amarius Toto, MD [facility]  

## 2022-11-11 NOTE — Telephone Encounter (Signed)
L/m for patient to reach out to discuss Xolair and patient assistance for same

## 2022-11-11 NOTE — Telephone Encounter (Signed)
Patient called back and stated she does not want to start Xolair at this time

## 2022-11-11 NOTE — Telephone Encounter (Signed)
-----   Message from Alfonse Spruce, MD sent at 11/11/2022  8:30 AM EDT ----- Sorry - meant to forward to you. Xolair for food allergies.

## 2023-05-21 ENCOUNTER — Ambulatory Visit: Payer: Medicare PPO | Admitting: Allergy & Immunology

## 2023-05-21 ENCOUNTER — Encounter: Payer: Self-pay | Admitting: Allergy & Immunology

## 2023-05-21 ENCOUNTER — Other Ambulatory Visit: Payer: Self-pay

## 2023-05-21 VITALS — BP 128/84 | HR 90 | Temp 97.4°F | Resp 18 | Ht 64.25 in | Wt 199.0 lb

## 2023-05-21 DIAGNOSIS — E039 Hypothyroidism, unspecified: Secondary | ICD-10-CM

## 2023-05-21 DIAGNOSIS — H3530 Unspecified macular degeneration: Secondary | ICD-10-CM

## 2023-05-21 DIAGNOSIS — T781XXA Other adverse food reactions, not elsewhere classified, initial encounter: Secondary | ICD-10-CM

## 2023-05-21 DIAGNOSIS — T781XXD Other adverse food reactions, not elsewhere classified, subsequent encounter: Secondary | ICD-10-CM | POA: Diagnosis not present

## 2023-05-21 DIAGNOSIS — F419 Anxiety disorder, unspecified: Secondary | ICD-10-CM | POA: Diagnosis not present

## 2023-05-21 NOTE — Patient Instructions (Addendum)
1. 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 - We will see where the levels are trending.  - EpiPen is up to date.   2. Return in about 1 year (around 05/20/2024). You can have the follow up appointment with Dr. Dellis Anes or a Nurse Practicioner (our Nurse Practitioners are excellent and always have Physician oversight!).    Please inform us of any Emergency Department visits, hospitalizations, or changes in symptoms. Call us before going to the ED for breathing or allergy symptoms since we might be able to fit you in for a sick visit. Feel free to contact us anytime with any questions, problems, or concerns.  It was a pleasure to se you again today! Happy Thanksgiving and [place] Christmas!   Websites that have reliable patient information: 1. American Academy of Asthma, Allergy, and Immunology: www.aaaai.org 2. Food Allergy Research and Education (FARE): foodallergy.org 3. Mothers of Asthmatics: http://www.asthmacommunitynetwork.org 4. American College of Allergy, Asthma, and Immunology: www.acaai.org      "Like" [place] on Facebook and Instagram for our latest updates!      A healthy democracy works best when [employer] participate! Make sure you are registered to vote! If you have moved or changed any of your contact information, you will need to get this updated before voting! Scan the QR codes below to learn more!

## 2023-05-21 NOTE — Progress Notes (Signed)
FOLLOW UP  Date of Service/Encounter:  05/21/23   Assessment:   Allergic reaction to alpha-gal   Hypothyroidism - on levothyroxine   Anxiety - on Zoloft 50mg  daily  Macular degeneration - on regular injections for this  Plan/Recommendations:   1. 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 - We will see where the levels are trending.  - EpiPen is up to date.   2. Return in about 1 year (around 05/20/2024). You can have the follow up appointment with Dr. Dellis Anes or a Nurse Practicioner (our Nurse Practitioners are excellent and always have Physician oversight!).     Subjective:   Dana Owens is a 72 y.o. female presenting today for follow up of  Chief Complaint  Patient presents with   Other    No issues with alpha- gal - face, ears, eye swelling from read meat     Jeoffrey Massed has a history of the following: Patient Active Problem List   Diagnosis Date Noted   Allergic reaction to alpha-gal 11/02/2022    History obtained from: chart review and patient.  Discussed the use of AI scribe software for clinical note transcription with the patient and/or guardian, who gave verbal consent to proceed.  Dana Owens is a 72 y.o. female presenting for a follow up visit.  She was last seen in May 2024.  At that time, we rechecked her alpha gal levels.  We gave her EpiPen training and emergency action plan.  Her alpha gal levels were still very high with a total IgE of alpha gal of 25.5.  We did offer the use of Xolair for food allergies. Tammy talked to her on any 15th and she was not interested in starting Xolair at this time.  Since last visit, she has done very well.  She reports that she has been maintaining a diet devoid of meat, primarily consuming chicken. They report no accidental exposure to allergens since the last visit. They express some fatigue with the dietary monotony but are managing by exploring different ways to prepare  chicken. They have not experienced any allergic reactions recently.  She really is not very interested in trying to expand her diet protein wise.  We did talk about adding on emu or the gal safe pork, but she does not want to look into that at all.  She also is not having any cross contamination episodes, so she is only interested in Xolair.  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 They have been started on vitamins and are receiving monthly injections for the same. The patient notes a familial history of MD. Despite the recent diagnosis, they report an improvement in vision compared to two months ago.  The patient also mentions frequent travels to [place] to visit family. They have not been traveling as often due to the recent diagnosis of MD. They report no other known food allergies and confirm that their EpiPen is up to date.  They have several grandchildren in [place] that they enjoy seeing.    Otherwise, there have been no changes to her past medical history, surgical history, family history, or social history.    Review of systems otherwise negative other than that mentioned in the HPI.    Objective:   Blood pressure 128/84, pulse 90, temperature (!) 97.4 F (36.3 C), resp. rate 18, height 5' 4.25" (1.632 m), weight 199  lb (90.3 kg), SpO2 96%. Body mass index is 33.89 kg/m.    Physical Exam Vitals reviewed.  Constitutional:      Appearance: She is well-developed.     Comments: Friendly. Cooperative with the exam.   HENT:     Head: Normocephalic and atraumatic.     Right Ear: Tympanic membrane, ear canal and external ear normal. No drainage, swelling or tenderness. Tympanic membrane is not injected, scarred, erythematous, retracted or bulging.     Left Ear: Tympanic membrane, ear canal and external ear normal. No drainage, swelling or tenderness. Tympanic membrane is not injected, scarred, erythematous,  retracted or bulging.     Nose: No nasal deformity, septal deviation, mucosal edema or rhinorrhea.     Right Turbinates: Enlarged, swollen and pale.     Left Turbinates: Enlarged, swollen and pale.     Right Sinus: No maxillary sinus tenderness or frontal sinus tenderness.     Left Sinus: No maxillary sinus tenderness or frontal sinus tenderness.     Mouth/Throat:     Mouth: Mucous membranes are not pale and not dry.     Pharynx: Uvula midline.  Eyes:     General:        Right eye: No discharge.        Left eye: No discharge.     Conjunctiva/sclera: Conjunctivae normal.     Right eye: Right conjunctiva is not injected. No chemosis.    Left eye: Left conjunctiva is not injected. No chemosis.    Pupils: Pupils are equal, round, and reactive to light.  Cardiovascular:     Rate and Rhythm: Normal rate and regular rhythm.     Heart sounds: Normal heart sounds.  Pulmonary:     Effort: Pulmonary effort is normal. No tachypnea, accessory muscle usage or respiratory distress.     Breath sounds: Normal breath sounds. No wheezing, rhonchi or rales.     Comments: Moving air well in all lung fields.  No increased work of breathing. Chest:     Chest wall: No tenderness.  Abdominal:     Tenderness: There is no abdominal tenderness. There is no guarding or rebound.  Lymphadenopathy:     Head:     Right side of head: No submandibular, tonsillar or occipital adenopathy.     Left side of head: No submandibular, tonsillar or occipital adenopathy.     Cervical: No cervical adenopathy.  Skin:    Coloration: Skin is not pale.     Findings: No abrasion, erythema, petechiae or rash. Rash is not papular, urticarial or vesicular.  Neurological:     Mental Status: She is alert.  Psychiatric:        Behavior: Behavior is cooperative.      Diagnostic studies: labs sent instead       Malachi Bonds, MD  [facility]

## 2023-05-23 LAB — ALPHA-GAL PANEL
Allergen Lamb IgE: 13.8 kU/L — AB
Beef IgE: 15.6 kU/L — AB
IgE (Immunoglobulin E), Serum: 109 [IU]/mL (ref 6–495)
O215-IgE Alpha-Gal: 17.7 kU/L — AB
Pork IgE: 11 kU/L — AB

## 2023-06-02 ENCOUNTER — Ambulatory Visit: Payer: Medicare PPO | Admitting: Family Medicine

## 2023-06-02 VITALS — BP 128/88 | HR 98 | Temp 97.6°F | Resp 18

## 2023-06-02 DIAGNOSIS — L2389 Allergic contact dermatitis due to other agents: Secondary | ICD-10-CM

## 2023-06-02 DIAGNOSIS — Z91018 Allergy to other foods: Secondary | ICD-10-CM | POA: Diagnosis not present

## 2023-06-02 DIAGNOSIS — L282 Other prurigo: Secondary | ICD-10-CM

## 2023-06-02 MED ORDER — DESONIDE 0.05 % EX CREA: TOPICAL_CREAM | Freq: Two times a day (BID) | CUTANEOUS | 0 refills | Status: AC

## 2023-06-02 NOTE — Progress Notes (Signed)
[address] [place] [address] Dept: [phone]  FOLLOW UP NOTE  Patient ID: Dana Owens, female    DOB: 11/09/1950  Age: 72 y.o. MRN: [id number] Date of Office Visit: 06/02/2023  Assessment  Chief Complaint: Rash  HPI Dana Owens is a 72 year old female who presents to the clinic for an evaluation of rash on her face.  She was last seen in this clinic on 11/02/2022 by Dr. Dellis Anes as a new patient for evaluation of urticaria and alpha gal allergy.  Her last alpha gal allergy via lab test was positive to alpha gal and components on 11/12/2022.  At today's visit, she reports that she began to experience redness, raised, itchy areas that occurred on her face only.  She denies concomitant cardiopulmonary or gastrointestinal symptoms with these hives and redness.  She reports the only area affected was her face and under her chin.  She reports that she used a a facial product that she has used before and a gel, however, she used this product from the same company in a lotion on Sunday.  Otherwise, she denies new foods, new medications, recent illness, or insect stings.  She continues to avoid mammalian meat.  She reports that several days before the breakouts she ate at an [employer] with food including honey chicken and broccoli.  She notes that she approached the managers to let them know she had an alpha gal allergy.  She took hydroxyzine 2 days with significant relief of symptoms.  We discussed patch testing at this time.  She continues to avoid mammalian meat with no accidental ingestion or EpiPen use since her last visit to this clinic.  She reports that she eats small amounts of cheese from time to time without adverse reaction.  She reports that she has previously had to reactions likely from mammal meat ingestion which resulted in full body hives.  She did not experience cardiopulmonary or gastrointestinal symptoms with either of these episodes.  EpiPen set  is up-to-date.  Her current medications are listed in the chart.  Drug Allergies:  Allergies  Allergen Reactions   Alpha-Gal Hives, Itching and Other (See Comments)    Physical Exam: BP 128/88   Pulse 98   Temp 97.6 F (36.4 C)   Resp 18   SpO2 95%    Physical Exam Vitals reviewed.  Constitutional:      Appearance: Normal appearance.  HENT:     Head: Normocephalic and atraumatic.     Right Ear: Tympanic membrane normal.     Left Ear: Tympanic membrane normal.     Nose:     Comments: Bilateral nares normal.  Pharynx normal.  Ears normal.  Eyes normal.    Mouth/Throat:     Pharynx: Oropharynx is clear.  Eyes:     Conjunctiva/sclera: Conjunctivae normal.  Cardiovascular:     Rate and Rhythm: Normal rate and regular rhythm.     Heart sounds: Normal heart sounds. No murmur heard. Pulmonary:     Effort: Pulmonary effort is normal.     Breath sounds: Normal breath sounds.     Comments: Lungs clear to auscultation Musculoskeletal:        General: Normal range of motion.     Cervical back: Normal range of motion and neck supple.  Skin:    General: Skin is warm and dry.     Comments: Some scattered raised areas covering all areas of her face.  Slight patches of erythema.  Some  dry patches mainly on her cheeks.  No open areas or drainage noted.  No other body parts affected.  Neurological:     Mental Status: She is alert and oriented to person, place, and time.  Psychiatric:        Mood and Affect: Mood normal.        Behavior: Behavior normal.        Thought Content: Thought content normal.        Judgment: Judgment normal.     Assessment and Plan: 1. Allergic contact dermatitis due to other agents   2. Allergy to alpha-gal   3. Papular urticaria     Meds ordered this encounter  Medications   desonide (DESOWEN) 0.05 % cream    Sig: Apply topically 2 (two) times daily.    Dispense:  30 g    Refill:  0    Patient Instructions  Allergic contact  dermatitis/papular urticaria Continue to avoid the new facial cream Continue hydroxyzine at bedtime as needed for control of itch Begin desonide 0.05% cream up to twice a day as needed for red or itchy areas Consider patch testing.  Patches are placed on Monday, removed on Wednesday, first reading on Wednesday, and final reading on Friday.  Call the clinic if you are not interested in this testing.  Alpha gal allergy Continue to avoid mammalian meats. In case of an allergic reaction, take Benadryl 50 mg every 4 hours, and if life-threatening symptoms occur, inject with EpiPen 0.3 mg. Let's retest these labs in about 6 months. Call the clinic if this treatment plan is not working well for you.  Follow up in 6 months or sooner if needed.  Return in about 6 months (around 12/01/2023), or if symptoms worsen or fail to improve.    Thank you for the opportunity to care for this patient.  Please do not hesitate to contact me with questions.  Thermon Leyland, FNP [facility]

## 2023-06-02 NOTE — Patient Instructions (Addendum)
Allergic contact dermatitis/papular urticaria Continue to avoid the new facial cream Continue hydroxyzine at bedtime as needed for control of itch Begin desonide 0.05% cream up to twice a day as needed for red or itchy areas Consider patch testing.  Patches are placed on Monday, removed on Wednesday, first reading on Wednesday, and final reading on Friday.  Call the clinic if you are not interested in this testing.  Alpha gal allergy Continue to avoid mammalian meats. In case of an allergic reaction, take Benadryl 50 mg every 4 hours, and if life-threatening symptoms occur, inject with EpiPen 0.3 mg. Let's retest these labs in about 6 months. Call the clinic if this treatment plan is not working well for you.  Follow up in 6 months or sooner if needed.

## 2023-06-03 ENCOUNTER — Encounter: Payer: Self-pay | Admitting: Family Medicine

## 2023-06-03 DIAGNOSIS — L282 Other prurigo: Secondary | ICD-10-CM | POA: Insufficient documentation

## 2023-06-03 DIAGNOSIS — L2389 Allergic contact dermatitis due to other agents: Secondary | ICD-10-CM | POA: Insufficient documentation

## 2023-06-03 DIAGNOSIS — Z91018 Allergy to other foods: Secondary | ICD-10-CM | POA: Insufficient documentation

## 2023-12-03 ENCOUNTER — Encounter: Payer: Self-pay | Admitting: Allergy & Immunology

## 2023-12-03 ENCOUNTER — Ambulatory Visit: Payer: Medicare PPO | Admitting: Allergy & Immunology

## 2023-12-03 VITALS — BP 108/70 | HR 97 | Temp 97.7°F | Wt 199.4 lb

## 2023-12-03 DIAGNOSIS — L2389 Allergic contact dermatitis due to other agents: Secondary | ICD-10-CM

## 2023-12-03 DIAGNOSIS — Z91018 Allergy to other foods: Secondary | ICD-10-CM | POA: Diagnosis not present

## 2023-12-03 MED ORDER — EPINEPHRINE 0.3 MG/0.3ML IJ SOAJ: 0.3000 mg | INTRAMUSCULAR | 2 refills | Status: DC | PRN

## 2023-12-03 NOTE — Patient Instructions (Addendum)
 Allergic contact dermatitis/papular urticaria - Continue to avoid the new facial cream - Continue hydroxyzine  at bedtime as needed for control of itch - Continue with desonide  0.05% cream up to twice a day as needed for red or itchy areas - Consider patch testing.    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 - We will recheck the alpha gal and see where the levels are trending.  - EpiPen  renewed today.     3. Return in about 6 months (around 06/03/2024). You can have the follow up appointment with Dr. Idolina Maker or a Nurse Practicioner (our Nurse Practitioners are excellent and always have Physician oversight!).    Please inform us  of any Emergency Department visits, hospitalizations, or changes in symptoms. Call us  before going to the ED for breathing or allergy symptoms since we might be able to fit you in for a sick visit. Feel free to contact us  anytime with any questions, problems, or concerns.  It was a pleasure to see you again today!  Websites that have reliable patient information: 1. American Academy of Asthma, Allergy, and Immunology: www.aaaai.org 2. Food Allergy Research and Education (FARE): foodallergy.org 3. Mothers of Asthmatics: http://www.asthmacommunitynetwork.org 4. American College of Allergy, Asthma, and Immunology: www.acaai.org      "Like" us  on Facebook and Instagram for our latest updates!      A healthy democracy works best when [employer] participate! Make sure you are registered to vote! If you have moved or changed any of your contact information, you will need to get this updated before voting! Scan the QR codes below to learn more!

## 2023-12-03 NOTE — Progress Notes (Signed)
 FOLLOW UP  Date of Service/Encounter:  12/03/23   Assessment:   Allergic reaction to alpha-gal   Hypothyroidism - on levothyroxine   Anxiety - on Zoloft 50mg  daily   Macular degeneration - on regular injections for this  Plan/Recommendations:   Allergic contact dermatitis/papular urticaria - Continue to avoid the new facial cream - Continue hydroxyzine  at bedtime as needed for control of itch - Continue with desonide  0.05% cream up to twice a day as needed for red or itchy areas - Consider patch testing.    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 - We will recheck the alpha gal and see where the levels are trending.  - EpiPen  renewed today.     3. Return in about 6 months (around 06/03/2024). You can have the follow up appointment with Dr. Idolina Maker or a Nurse Practicioner (our Nurse Practitioners are excellent and always have Physician oversight!).    Subjective:   Dana Owens is a 73 y.o. female presenting today for follow up of  Chief Complaint  Patient presents with   Follow-up    Dana Owens has a history of the following: Patient Active Problem List   Diagnosis Date Noted   Allergic contact dermatitis due to other agents 06/03/2023   Allergy to alpha-gal 06/03/2023   Papular urticaria 06/03/2023   Allergic reaction to alpha-gal 11/02/2022    History obtained from: chart review and patient.  Discussed the use of AI scribe software for clinical note transcription with the patient and/or guardian, who gave verbal consent to proceed.  Dana Owens is a 73 y.o. female presenting for a follow up visit.  She was last seen in December 2024.  At that time, she was started on desonide  0.05% cream up to twice daily as needed.  We did talk about doing patch testing.  For the alpha gal allergy, we recommended continuing with avoidance of red meats.  Patch testing was discussed as well.  Since last visit, she has done well.  Food Allergy Symptom  History: She mentions having ticks everywhere.  She was open to repeat testing for alpha gal. She has an EpiPen , indicating a history of severe allergic reactions.  Skin Symptom History: The rash is currently stable with no recent breakouts.  She cannot remember the last time she had a breakout.  She does not have any ointments that she is on routine basis for the breakouts.  She does have a dust mite that she uses occasionally.  Is not very frequent.  She would like a refill.  Otherwise, there have been no changes to her past medical history, surgical history, family history, or social history.    Review of systems otherwise negative other than that mentioned in the HPI.    Objective:   Blood pressure 108/70, pulse 97, temperature 97.7 F (36.5 C), weight 199 lb 6 oz (90.4 kg), SpO2 95%. Body mass index is 33.96 kg/m.    Physical Exam Vitals reviewed.  Constitutional:      Appearance: She is well-developed.     Comments: Friendly. Cooperative with the exam.   HENT:     Head: Normocephalic and atraumatic.     Right Ear: Tympanic membrane, ear canal and external ear normal. No drainage, swelling or tenderness. Tympanic membrane is not injected, scarred, erythematous, retracted or bulging.     Left Ear: Tympanic membrane, ear canal and external ear normal. No drainage, swelling or tenderness. Tympanic membrane is not injected, scarred, erythematous, retracted or  bulging.     Nose: No nasal deformity, septal deviation, mucosal edema or rhinorrhea.     Right Turbinates: Enlarged, swollen and pale.     Left Turbinates: Enlarged, swollen and pale.     Right Sinus: No maxillary sinus tenderness or frontal sinus tenderness.     Left Sinus: No maxillary sinus tenderness or frontal sinus tenderness.     Mouth/Throat:     Mouth: Mucous membranes are not pale and not dry.     Pharynx: Uvula midline.  Eyes:     General:        Right eye: No discharge.        Left eye: No discharge.      Conjunctiva/sclera: Conjunctivae normal.     Right eye: Right conjunctiva is not injected. No chemosis.    Left eye: Left conjunctiva is not injected. No chemosis.    Pupils: Pupils are equal, round, and reactive to light.  Cardiovascular:     Rate and Rhythm: Normal rate and regular rhythm.     Heart sounds: Normal heart sounds.  Pulmonary:     Effort: Pulmonary effort is normal. No tachypnea, accessory muscle usage or respiratory distress.     Breath sounds: Normal breath sounds. No wheezing, rhonchi or rales.     Comments: Moving air well in all lung fields.  No increased work of breathing. Chest:     Chest wall: No tenderness.  Abdominal:     Tenderness: There is no abdominal tenderness. There is no guarding or rebound.  Lymphadenopathy:     Head:     Right side of head: No submandibular, tonsillar or occipital adenopathy.     Left side of head: No submandibular, tonsillar or occipital adenopathy.     Cervical: No cervical adenopathy.  Skin:    Coloration: Skin is not pale.     Findings: No abrasion, erythema, petechiae or rash. Rash is not papular, urticarial or vesicular.  Neurological:     Mental Status: She is alert.  Psychiatric:        Behavior: Behavior is cooperative.      Diagnostic studies: labs sent instead     Drexel Gentles, MD  [facility] 

## 2023-12-06 ENCOUNTER — Encounter: Payer: Self-pay | Admitting: Allergy & Immunology

## 2023-12-06 LAB — ALPHA-GAL PANEL
Allergen Lamb IgE: 25.4 kU/L — AB
Beef IgE: 29.3 kU/L — AB
IgE (Immunoglobulin E), Serum: 117 [IU]/mL (ref 6–495)
O215-IgE Alpha-Gal: 31.8 kU/L — AB
Pork IgE: 20.3 kU/L — AB

## 2023-12-07 ENCOUNTER — Ambulatory Visit: Payer: Self-pay | Admitting: Allergy & Immunology

## 2023-12-07 LAB — TRYPTASE: Tryptase: 8.5 ug/L (ref 2.2–13.2)

## 2024-03-10 ENCOUNTER — Ambulatory Visit (HOSPITAL_COMMUNITY): Payer: Self-pay | Admitting: Physician Assistant

## 2024-03-21 ENCOUNTER — Encounter (HOSPITAL_COMMUNITY): Payer: Self-pay

## 2024-03-21 NOTE — Progress Notes (Signed)
 Surgical Instructions   Your procedure is scheduled on Thursday March 23, 2024. Report to [facility] Main Entrance A at 5:30 A.M., then check in with the Admitting office. Any questions or running late day of surgery: call [phone]  Questions prior to your surgery date: call [phone], Monday-Friday, 8am-4pm. If you experience any cold or flu symptoms such as cough, fever, chills, shortness of breath, etc. between now and your scheduled surgery, please notify us  at the above number.     Remember:  Do not eat after midnight the night before your surgery  You may drink clear liquids until 4:30 the morning of your surgery.   Clear liquids allowed are: Water, Non-Citrus Juices (without pulp), Carbonated Beverages, Clear Tea (no milk, honey, etc.), Black Coffee Only (NO MILK, CREAM OR POWDERED CREAMER of any kind), and Gatorade.    Take these medicines the morning of surgery with A SIP OF WATER  levothyroxine (SYNTHROID)  sertraline (ZOLOFT)   May take these medicines IF NEEDED: carboxymethylcellulose 1 % ophthalmic solution    One week prior to surgery, STOP taking any Aspirin (unless otherwise instructed by your surgeon) Aleve, Naproxen, Ibuprofen, Motrin, Advil, Goody's, BC's, all herbal medications, fish oil, and non-prescription vitamins.                     Do NOT Smoke (Tobacco/Vaping) for 24 hours prior to your procedure.  If you use a CPAP at night, you may bring your mask/headgear for your overnight stay.   You will be asked to remove any contacts, glasses, piercing's, hearing aid's, dentures/partials prior to surgery. Please bring cases for these items if needed.    Patients discharged the day of surgery will not be allowed to drive home, and someone needs to stay with them for 24 hours.  SURGICAL WAITING ROOM VISITATION Patients may have no more than 2 support people in the waiting area - these visitors may rotate.   Pre-op nurse will coordinate an  appropriate time for 1 ADULT support person, who may not rotate, to accompany patient in pre-op.  Children under the age of 71 must have an adult with them who is not the patient and must remain in the main waiting area with an adult.  If the patient needs to stay at the hospital during part of their recovery, the visitor guidelines for inpatient rooms apply.  Please refer to the [facility] website for the visitor guidelines for any additional information.   If you received a COVID test during your pre-op visit  it is requested that you wear a mask when out in public, stay away from anyone that may not be feeling well and notify your surgeon if you develop symptoms. If you have been in contact with anyone that has tested positive in the last 10 days please notify you surgeon.      Pre-operative 5 CHG Bathing Instructions   You can play a key role in reducing the risk of infection after surgery. Your skin needs to be as free of germs as possible. You can reduce the number of germs on your skin by washing with CHG (chlorhexidine  gluconate) soap before surgery. CHG is an antiseptic soap that kills germs and continues to kill germs even after washing.   DO NOT use if you have an allergy to chlorhexidine /CHG or antibacterial soaps. If your skin becomes reddened or irritated, stop using the CHG and notify one of our RNs at [phone].   Please shower with the  CHG soap starting 4 days before surgery using the following schedule:     Please keep in mind the following:  DO NOT shave, including legs and underarms, starting the day of your first shower.   Place clean sheets on your bed the day you start using CHG soap. Use a clean washcloth (not used since being washed) for each shower. DO NOT sleep with pets once you start using the CHG.   CHG Shower Instructions:  Wash your face and private area with normal soap. If you choose to wash your hair, wash first with your normal shampoo.  After  you use shampoo/soap, rinse your hair and body thoroughly to remove shampoo/soap residue.  Turn the water OFF and apply about 3 tablespoons (45 ml) of CHG soap to a CLEAN washcloth.  Apply CHG soap ONLY FROM YOUR NECK DOWN TO YOUR TOES (washing for 3-5 minutes)  DO NOT use CHG soap on face, private areas, open wounds, or sores.  Pay special attention to the area where your surgery is being performed.  If you are having back surgery, having someone wash your back for you may be helpful. Wait 2 minutes after CHG soap is applied, then you may rinse off the CHG soap.  Pat dry with a clean towel  Put on clean clothes/pajamas   If you choose to wear lotion, please use ONLY the CHG-compatible lotions that are listed below.  Additional instructions for the day of surgery: DO NOT APPLY any lotions, deodorants or perfumes.   Do not bring valuables to the hospital. [facility] is not responsible for any belongings/valuables. Do not wear nail polish, gel polish, artificial nails, or any other type of covering on natural nails (fingers and toes) Do not wear jewelry or makeup Put on clean/comfortable clothes.  Please brush your teeth.  Ask your nurse before applying any prescription medications to the skin.     CHG Compatible Lotions   Aveeno Moisturizing lotion  Cetaphil Moisturizing Cream  Cetaphil Moisturizing Lotion  Clairol Herbal Essence Moisturizing Lotion, Dry Skin  Clairol Herbal Essence Moisturizing Lotion, Extra Dry Skin  Clairol Herbal Essence Moisturizing Lotion, Normal Skin  Curel Age Defying Therapeutic Moisturizing Lotion with Alpha Hydroxy  Curel Extreme Care Body Lotion  Curel Soothing Hands Moisturizing Hand Lotion  Curel Therapeutic Moisturizing Cream, Fragrance-Free  Curel Therapeutic Moisturizing Lotion, Fragrance-Free  Curel Therapeutic Moisturizing Lotion, Original Formula  Eucerin Daily Replenishing Lotion  Eucerin Dry Skin Therapy Plus Alpha Hydroxy Crme  Eucerin  Dry Skin Therapy Plus Alpha Hydroxy Lotion  Eucerin Original Crme  Eucerin Original Lotion  Eucerin Plus Crme Eucerin Plus Lotion  Eucerin TriLipid Replenishing Lotion  Keri Anti-Bacterial Hand Lotion  Keri Deep Conditioning Original Lotion Dry Skin Formula Softly Scented  Keri Deep Conditioning Original Lotion, Fragrance Free Sensitive Skin Formula  Keri Lotion Fast Absorbing Fragrance Free Sensitive Skin Formula  Keri Lotion Fast Absorbing Softly Scented Dry Skin Formula  Keri Original Lotion  Keri Skin Renewal Lotion Keri Silky Smooth Lotion  Keri Silky Smooth Sensitive Skin Lotion  Nivea Body Creamy Conditioning Oil  Nivea Body Extra Enriched Lotion  Nivea Body Original Lotion  Nivea Body Sheer Moisturizing Lotion Nivea Crme  Nivea Skin Firming Lotion  NutraDerm 30 Skin Lotion  NutraDerm Skin Lotion  NutraDerm Therapeutic Skin Cream  NutraDerm Therapeutic Skin Lotion  ProShield Protective Hand Cream  Provon moisturizing lotion  Please read over the following fact sheets that you were given.

## 2024-03-22 ENCOUNTER — Encounter (HOSPITAL_COMMUNITY): Payer: Self-pay

## 2024-03-22 ENCOUNTER — Encounter (HOSPITAL_COMMUNITY)
Admission: RE | Admit: 2024-03-22 | Discharge: 2024-03-22 | Disposition: A | Discharge status: Home / Self Care | Source: Ambulatory Visit | Attending: Orthopedic Surgery | Admitting: Orthopedic Surgery

## 2024-03-22 ENCOUNTER — Other Ambulatory Visit: Payer: Self-pay

## 2024-03-22 VITALS — BP 122/69 | HR 83 | Temp 97.8°F | Resp 17 | Ht 64.0 in | Wt 199.8 lb

## 2024-03-22 DIAGNOSIS — Z01818 Encounter for other preprocedural examination: Secondary | ICD-10-CM | POA: Diagnosis present

## 2024-03-22 DIAGNOSIS — Z01812 Encounter for preprocedural laboratory examination: Secondary | ICD-10-CM | POA: Insufficient documentation

## 2024-03-22 HISTORY — DX: Unspecified osteoarthritis, unspecified site: M19.90

## 2024-03-22 HISTORY — DX: Anxiety disorder, unspecified: F41.9

## 2024-03-22 HISTORY — DX: Other complications of anesthesia, initial encounter: T88.59XA

## 2024-03-22 LAB — SURGICAL PCR SCREEN
MRSA, PCR: NEGATIVE
Staphylococcus aureus: NEGATIVE

## 2024-03-22 LAB — CBC
HCT: 41.6 % (ref 36.0–46.0)
Hemoglobin: 13 g/dL (ref 12.0–15.0)
MCH: 29.3 pg (ref 26.0–34.0)
MCHC: 31.3 g/dL (ref 30.0–36.0)
MCV: 93.7 fL (ref 80.0–100.0)
Platelets: 222 K/uL (ref 150–400)
RBC: 4.44 MIL/uL (ref 3.87–5.11)
RDW: 13.5 % (ref 11.5–15.5)
WBC: 7.7 K/uL (ref 4.0–10.5)
nRBC: 0 % (ref 0.0–0.2)

## 2024-03-22 NOTE — Progress Notes (Signed)
 PCP - denies Endocrinologist: Dr. Jesus Cardiologist - denies  PPM/ICD - denies Device Orders - na Rep Notified - na  Chest x-ray - na EKG - na Stress Test -  ECHO -  Cardiac Cath -   Sleep Study - denies CPAP - na  Non-diabetic  Blood Thinner Instructions: denies Aspirin Instructions:denies  ERAS Protcol - Clears until 0430 Anesthesia review: No  Patient denies shortness of breath, fever, cough and chest pain at PAT appointment   All instructions explained to the patient, with a verbal understanding of the material. Patient agrees to go over the instructions while at home for a better understanding. Patient also instructed to self quarantine after being tested for COVID-19. The opportunity to ask questions was provided.

## 2024-03-23 ENCOUNTER — Observation Stay (HOSPITAL_COMMUNITY)
Admission: RE | Admit: 2024-03-23 | Discharge: 2024-03-24 | Disposition: A | Discharge status: Home / Self Care | Attending: Orthopedic Surgery | Admitting: Orthopedic Surgery

## 2024-03-23 ENCOUNTER — Ambulatory Visit (HOSPITAL_COMMUNITY)

## 2024-03-23 ENCOUNTER — Encounter (HOSPITAL_COMMUNITY)
Admission: RE | Disposition: A | Discharge status: Home / Self Care | Payer: Self-pay | Source: Home / Self Care | Attending: Orthopedic Surgery

## 2024-03-23 ENCOUNTER — Other Ambulatory Visit: Payer: Self-pay

## 2024-03-23 ENCOUNTER — Encounter (HOSPITAL_COMMUNITY): Payer: Self-pay | Admitting: Orthopedic Surgery

## 2024-03-23 DIAGNOSIS — E039 Hypothyroidism, unspecified: Secondary | ICD-10-CM | POA: Insufficient documentation

## 2024-03-23 DIAGNOSIS — M50122 Cervical disc disorder at C5-C6 level with radiculopathy: Principal | ICD-10-CM | POA: Insufficient documentation

## 2024-03-23 DIAGNOSIS — M5020 Other cervical disc displacement, unspecified cervical region: Principal | ICD-10-CM | POA: Diagnosis present

## 2024-03-23 DIAGNOSIS — M4722 Other spondylosis with radiculopathy, cervical region: Secondary | ICD-10-CM | POA: Diagnosis not present

## 2024-03-23 DIAGNOSIS — M50123 Cervical disc disorder at C6-C7 level with radiculopathy: Secondary | ICD-10-CM | POA: Diagnosis not present

## 2024-03-23 DIAGNOSIS — M5412 Radiculopathy, cervical region: Secondary | ICD-10-CM

## 2024-03-23 HISTORY — PX: ANTERIOR CERVICAL DECOMP/DISCECTOMY FUSION: SHX1161

## 2024-03-23 LAB — NO BLOOD PRODUCTS

## 2024-03-23 SURGERY — ANTERIOR CERVICAL DECOMPRESSION/DISCECTOMY FUSION 2 LEVELS
Anesthesia: General | Site: Spine Cervical

## 2024-03-23 MED ORDER — AMISULPRIDE (ANTIEMETIC) 5 MG/2ML IV SOLN: 10.0000 mg | Freq: Once | INTRAVENOUS | Status: DC | PRN

## 2024-03-23 MED ORDER — ORAL CARE MOUTH RINSE: 15.0000 mL | Freq: Once | OROMUCOSAL | Status: AC

## 2024-03-23 MED ORDER — LIDOCAINE 2% (20 MG/ML) 5 ML SYRINGE
INTRAMUSCULAR | Status: AC
  Filled 2024-03-23: qty 5

## 2024-03-23 MED ORDER — ACETAMINOPHEN 500 MG PO TABS
1000.0000 mg | ORAL_TABLET | Freq: Once | ORAL | Status: AC
  Administered 2024-03-23: 1000 mg via ORAL
  Filled 2024-03-23: qty 2

## 2024-03-23 MED ORDER — SODIUM CHLORIDE 0.9% FLUSH: 3.0000 mL | INTRAVENOUS | Status: DC | PRN

## 2024-03-23 MED ORDER — ROCURONIUM BROMIDE 10 MG/ML (PF) SYRINGE
PREFILLED_SYRINGE | INTRAVENOUS | Status: DC | PRN
  Administered 2024-03-23 (×2): 30 mg via INTRAVENOUS
  Administered 2024-03-23: 20 mg via INTRAVENOUS
  Administered 2024-03-23: 60 mg via INTRAVENOUS

## 2024-03-23 MED ORDER — PHENYLEPHRINE 80 MCG/ML (10ML) SYRINGE FOR IV PUSH (FOR BLOOD PRESSURE SUPPORT)
PREFILLED_SYRINGE | INTRAVENOUS | Status: AC
  Filled 2024-03-23: qty 10

## 2024-03-23 MED ORDER — MIDAZOLAM HCL 2 MG/2ML IJ SOLN
INTRAMUSCULAR | Status: DC | PRN
  Administered 2024-03-23: 2 mg via INTRAVENOUS

## 2024-03-23 MED ORDER — MIDAZOLAM HCL 2 MG/2ML IJ SOLN
INTRAMUSCULAR | Status: AC
  Filled 2024-03-23: qty 2

## 2024-03-23 MED ORDER — METHOCARBAMOL 1000 MG/10ML IJ SOLN: 500.0000 mg | Freq: Four times a day (QID) | INTRAMUSCULAR | Status: DC | PRN

## 2024-03-23 MED ORDER — LIDOCAINE 2% (20 MG/ML) 5 ML SYRINGE
INTRAMUSCULAR | Status: DC | PRN
  Administered 2024-03-23: 60 mg via INTRAVENOUS

## 2024-03-23 MED ORDER — PHENYLEPHRINE HCL-NACL 20-0.9 MG/250ML-% IV SOLN
INTRAVENOUS | Status: DC | PRN
  Administered 2024-03-23: 20 ug/min via INTRAVENOUS

## 2024-03-23 MED ORDER — HYDROMORPHONE HCL 1 MG/ML IJ SOLN
0.2500 mg | INTRAMUSCULAR | Status: DC | PRN
  Administered 2024-03-23: 0.5 mg via INTRAVENOUS

## 2024-03-23 MED ORDER — ONDANSETRON HCL 4 MG/2ML IJ SOLN
INTRAMUSCULAR | Status: DC | PRN
  Administered 2024-03-23: 4 mg via INTRAVENOUS

## 2024-03-23 MED ORDER — OXYCODONE HCL 5 MG/5ML PO SOLN
5.0000 mg | ORAL | Status: DC | PRN
  Administered 2024-03-23: 5 mg via ORAL
  Filled 2024-03-23: qty 5

## 2024-03-23 MED ORDER — PROPOFOL 10 MG/ML IV BOLUS
INTRAVENOUS | Status: AC
Start: 2024-03-23 — End: 2024-03-23
  Filled 2024-03-23: qty 20

## 2024-03-23 MED ORDER — DEXMEDETOMIDINE HCL IN NACL 80 MCG/20ML IV SOLN
INTRAVENOUS | Status: DC | PRN
  Administered 2024-03-23 (×2): 8 ug via INTRAVENOUS

## 2024-03-23 MED ORDER — PHENOL 1.4 % MT LIQD: 1.0000 | OROMUCOSAL | Status: DC | PRN

## 2024-03-23 MED ORDER — METHOCARBAMOL 500 MG PO TABS
500.0000 mg | ORAL_TABLET | Freq: Four times a day (QID) | ORAL | Status: DC | PRN
  Administered 2024-03-23: 500 mg via ORAL
  Filled 2024-03-23: qty 1

## 2024-03-23 MED ORDER — ONDANSETRON HCL 4 MG/2ML IJ SOLN
INTRAMUSCULAR | Status: AC
  Filled 2024-03-23: qty 2

## 2024-03-23 MED ORDER — OXYCODONE HCL 5 MG/5ML PO SOLN: 10.0000 mg | ORAL | Status: DC | PRN

## 2024-03-23 MED ORDER — OXYCODONE HCL 5 MG PO TABS: 5.0000 mg | ORAL_TABLET | ORAL | Status: DC | PRN

## 2024-03-23 MED ORDER — FENTANYL CITRATE (PF) 250 MCG/5ML IJ SOLN
INTRAMUSCULAR | Status: AC
  Filled 2024-03-23: qty 5

## 2024-03-23 MED ORDER — LACTATED RINGERS IV SOLN: INTRAVENOUS | Status: DC

## 2024-03-23 MED ORDER — METHOCARBAMOL 500 MG PO TABS: 500.0000 mg | ORAL_TABLET | Freq: Three times a day (TID) | ORAL | 0 refills | Status: AC | PRN

## 2024-03-23 MED ORDER — IPRATROPIUM-ALBUTEROL 0.5-2.5 (3) MG/3ML IN SOLN
3.0000 mL | Freq: Once | RESPIRATORY_TRACT | Status: AC
  Administered 2024-03-23: 3 mL via RESPIRATORY_TRACT

## 2024-03-23 MED ORDER — IPRATROPIUM-ALBUTEROL 0.5-2.5 (3) MG/3ML IN SOLN
RESPIRATORY_TRACT | Status: AC
Start: 2024-03-23 — End: 2024-03-23
  Filled 2024-03-23: qty 3

## 2024-03-23 MED ORDER — OXYCODONE HCL 5 MG PO TABS: 5.0000 mg | ORAL_TABLET | Freq: Once | ORAL | Status: DC | PRN

## 2024-03-23 MED ORDER — OXYCODONE HCL 5 MG PO TABS: 10.0000 mg | ORAL_TABLET | ORAL | Status: DC | PRN

## 2024-03-23 MED ORDER — CEFAZOLIN SODIUM-DEXTROSE 1-4 GM/50ML-% IV SOLN
1.0000 g | Freq: Three times a day (TID) | INTRAVENOUS | Status: AC
  Administered 2024-03-23 (×2): 1 g via INTRAVENOUS
  Filled 2024-03-23 (×2): qty 50

## 2024-03-23 MED ORDER — SODIUM CHLORIDE 0.9% FLUSH
3.0000 mL | Freq: Two times a day (BID) | INTRAVENOUS | Status: DC
  Administered 2024-03-23: 3 mL via INTRAVENOUS

## 2024-03-23 MED ORDER — CHLORHEXIDINE GLUCONATE 0.12 % MT SOLN
15.0000 mL | Freq: Once | OROMUCOSAL | Status: AC
  Administered 2024-03-23: 15 mL via OROMUCOSAL
  Filled 2024-03-23: qty 15

## 2024-03-23 MED ORDER — PHENYLEPHRINE 80 MCG/ML (10ML) SYRINGE FOR IV PUSH (FOR BLOOD PRESSURE SUPPORT)
PREFILLED_SYRINGE | INTRAVENOUS | Status: DC | PRN
  Administered 2024-03-23 (×2): 160 ug via INTRAVENOUS

## 2024-03-23 MED ORDER — PROPOFOL 10 MG/ML IV BOLUS
INTRAVENOUS | Status: DC | PRN
Start: 2024-03-23 — End: 2024-03-23
  Administered 2024-03-23: 100 mg via INTRAVENOUS

## 2024-03-23 MED ORDER — BUPIVACAINE-EPINEPHRINE (PF) 0.25% -1:200000 IJ SOLN
INTRAMUSCULAR | Status: DC | PRN
  Administered 2024-03-23: 30 mL

## 2024-03-23 MED ORDER — ACETAMINOPHEN 650 MG RE SUPP: 650.0000 mg | RECTAL | Status: DC | PRN

## 2024-03-23 MED ORDER — FENTANYL CITRATE (PF) 250 MCG/5ML IJ SOLN
INTRAMUSCULAR | Status: DC | PRN
  Administered 2024-03-23 (×5): 50 ug via INTRAVENOUS

## 2024-03-23 MED ORDER — BUPIVACAINE-EPINEPHRINE (PF) 0.25% -1:200000 IJ SOLN
INTRAMUSCULAR | Status: AC
  Filled 2024-03-23: qty 30

## 2024-03-23 MED ORDER — ONDANSETRON HCL 4 MG/2ML IJ SOLN: 4.0000 mg | Freq: Four times a day (QID) | INTRAMUSCULAR | Status: DC | PRN

## 2024-03-23 MED ORDER — MENTHOL 3 MG MT LOZG: 1.0000 | LOZENGE | OROMUCOSAL | Status: DC | PRN

## 2024-03-23 MED ORDER — TRANEXAMIC ACID-NACL 1000-0.7 MG/100ML-% IV SOLN
1000.0000 mg | INTRAVENOUS | Status: AC
  Administered 2024-03-23: 1000 mg via INTRAVENOUS
  Filled 2024-03-23: qty 100

## 2024-03-23 MED ORDER — ONDANSETRON HCL 4 MG PO TABS: 4.0000 mg | ORAL_TABLET | Freq: Three times a day (TID) | ORAL | 0 refills | Status: DC | PRN

## 2024-03-23 MED ORDER — ROCURONIUM BROMIDE 10 MG/ML (PF) SYRINGE
PREFILLED_SYRINGE | INTRAVENOUS | Status: AC
  Filled 2024-03-23: qty 10

## 2024-03-23 MED ORDER — DEXAMETHASONE SODIUM PHOSPHATE 10 MG/ML IJ SOLN
INTRAMUSCULAR | Status: DC | PRN
  Administered 2024-03-23: 10 mg via INTRAVENOUS

## 2024-03-23 MED ORDER — DEXAMETHASONE SODIUM PHOSPHATE 10 MG/ML IJ SOLN
INTRAMUSCULAR | Status: AC
  Filled 2024-03-23: qty 1

## 2024-03-23 MED ORDER — ONDANSETRON HCL 4 MG PO TABS: 4.0000 mg | ORAL_TABLET | Freq: Four times a day (QID) | ORAL | Status: DC | PRN

## 2024-03-23 MED ORDER — HYDROMORPHONE HCL 1 MG/ML IJ SOLN
INTRAMUSCULAR | Status: AC
  Filled 2024-03-23: qty 1

## 2024-03-23 MED ORDER — OXYCODONE HCL 5 MG/5ML PO SOLN: 5.0000 mg | Freq: Once | ORAL | Status: DC | PRN

## 2024-03-23 MED ORDER — CEFAZOLIN SODIUM-DEXTROSE 2-4 GM/100ML-% IV SOLN
2.0000 g | INTRAVENOUS | Status: AC
  Administered 2024-03-23: 2 g via INTRAVENOUS
  Filled 2024-03-23: qty 100

## 2024-03-23 MED ORDER — POLYETHYLENE GLYCOL 3350 17 G PO PACK: 17.0000 g | PACK | Freq: Every day | ORAL | Status: DC | PRN

## 2024-03-23 MED ORDER — EPHEDRINE 5 MG/ML INJ
INTRAVENOUS | Status: AC
Start: 2024-03-23 — End: 2024-03-23
  Filled 2024-03-23: qty 5

## 2024-03-23 MED ORDER — THROMBIN 20000 UNITS EX SOLR
CUTANEOUS | Status: DC | PRN
  Administered 2024-03-23: 20 mL via TOPICAL

## 2024-03-23 MED ORDER — OXYCODONE-ACETAMINOPHEN 10-325 MG PO TABS: 1.0000 | ORAL_TABLET | Freq: Four times a day (QID) | ORAL | 0 refills | Status: DC | PRN

## 2024-03-23 MED ORDER — 0.9 % SODIUM CHLORIDE (POUR BTL) OPTIME
TOPICAL | Status: DC | PRN
  Administered 2024-03-23 (×2): 1000 mL

## 2024-03-23 MED ORDER — ACETAMINOPHEN 325 MG PO TABS: 650.0000 mg | ORAL_TABLET | ORAL | Status: DC | PRN

## 2024-03-23 MED ORDER — SODIUM CHLORIDE 0.9 % IV SOLN
250.0000 mL | INTRAVENOUS | Status: DC
Start: 2024-03-23 — End: 2024-03-24

## 2024-03-23 MED ORDER — ONDANSETRON HCL 4 MG/2ML IJ SOLN: 4.0000 mg | Freq: Once | INTRAMUSCULAR | Status: DC | PRN

## 2024-03-23 MED ORDER — THROMBIN 20000 UNITS EX SOLR
CUTANEOUS | Status: AC
  Filled 2024-03-23: qty 20000

## 2024-03-23 MED ORDER — SUGAMMADEX SODIUM 200 MG/2ML IV SOLN
INTRAVENOUS | Status: DC | PRN
  Administered 2024-03-23: 400 mg via INTRAVENOUS

## 2024-03-23 SURGICAL SUPPLY — 62 items
ALLOGRFT BNE OSSIFUSE FBR 1CC (Bone Implant) IMPLANT
BAG COUNTER SPONGE SURGICOUNT (BAG) ×1 IMPLANT
BLADE CLIPPER SURG (BLADE) IMPLANT
BUR EGG ELITE 4.0 (BURR) IMPLANT
BUR MATCHSTICK NEURO 3.0 LAGG (BURR) IMPLANT
CABLE BIPOLOR RESECTION CORD (MISCELLANEOUS) ×1 IMPLANT
CAGE LORDOTIC 6 SM (Cage) IMPLANT
CANISTER SUCTION 3000ML PPV (SUCTIONS) ×1 IMPLANT
CLSR STERI-STRIP ANTIMIC 1/2X4 (GAUZE/BANDAGES/DRESSINGS) ×1 IMPLANT
COVER MAYO STAND STRL (DRAPES) ×3 IMPLANT
COVER SURGICAL LIGHT HANDLE (MISCELLANEOUS) ×2 IMPLANT
DEVICE ENDSKLTN IMPLANT SM 7MM (Cage) IMPLANT
DRAPE C-ARM 42X72 X-RAY (DRAPES) ×1 IMPLANT
DRAPE POUCH INSTRU U-SHP 10X18 (DRAPES) ×1 IMPLANT
DRAPE SURG 17X23 STRL (DRAPES) ×1 IMPLANT
DRAPE U-SHAPE 47X51 STRL (DRAPES) ×1 IMPLANT
DRSG OPSITE POSTOP 3X4 (GAUZE/BANDAGES/DRESSINGS) ×1 IMPLANT
DRSG OPSITE POSTOP 4X6 (GAUZE/BANDAGES/DRESSINGS) IMPLANT
DURAPREP 26ML APPLICATOR (WOUND CARE) ×1 IMPLANT
ELECT COATED BLADE 2.86 ST (ELECTRODE) ×1 IMPLANT
ELECT PENCIL ROCKER SW 15FT (MISCELLANEOUS) ×1 IMPLANT
ELECTRODE REM PT RTRN 9FT ADLT (ELECTROSURGICAL) ×1 IMPLANT
GLOVE BIO SURGEON STRL SZ 6.5 (GLOVE) ×1 IMPLANT
GLOVE BIOGEL PI IND STRL 6.5 (GLOVE) ×1 IMPLANT
GLOVE BIOGEL PI IND STRL 8.5 (GLOVE) ×1 IMPLANT
GLOVE SS BIOGEL STRL SZ 8.5 (GLOVE) ×1 IMPLANT
GOWN STRL REUS W/ TWL LRG LVL3 (GOWN DISPOSABLE) ×1 IMPLANT
GOWN STRL REUS W/TWL 2XL LVL3 (GOWN DISPOSABLE) ×1 IMPLANT
KIT BASIN OR (CUSTOM PROCEDURE TRAY) ×1 IMPLANT
KIT TURNOVER KIT B (KITS) ×1 IMPLANT
NDL HYPO 22X1.5 SAFETY MO (MISCELLANEOUS) ×1 IMPLANT
NDL SPNL 18GX3.5 QUINCKE PK (NEEDLE) ×1 IMPLANT
NEEDLE HYPO 22X1.5 SAFETY MO (MISCELLANEOUS) ×1 IMPLANT
NEEDLE SPNL 18GX3.5 QUINCKE PK (NEEDLE) ×1 IMPLANT
PACK ORTHO CERVICAL (CUSTOM PROCEDURE TRAY) ×1 IMPLANT
PACK UNIVERSAL I (CUSTOM PROCEDURE TRAY) ×1 IMPLANT
PAD ARMBOARD POSITIONER FOAM (MISCELLANEOUS) ×2 IMPLANT
PIN DISTRATION 14MM (PIN) IMPLANT
PLATE ACP 1.6X36 2LVL (Plate) IMPLANT
POSITIONER HEAD DONUT 9IN (MISCELLANEOUS) ×1 IMPLANT
PUTTY DBX 1CC DEPUY (Putty) IMPLANT
RESTRAINT LIMB HOLDER UNIV (RESTRAINTS) ×1 IMPLANT
SCREW ACP 3.5X13 S/D VAR ANGLE (Screw) IMPLANT
SCREW ACP VA SD 3.5X15 (Screw) IMPLANT
SOLN 0.9% NACL 1000 ML (IV SOLUTION) ×1 IMPLANT
SOLN 0.9% NACL POUR BTL 1000ML (IV SOLUTION) ×1 IMPLANT
SOLN STERILE WATER 1000 ML (IV SOLUTION) ×1 IMPLANT
SOLN STERILE WATER BTL 1000 ML (IV SOLUTION) ×1 IMPLANT
SPONGE INTESTINAL PEANUT (DISPOSABLE) ×1 IMPLANT
SPONGE SURGIFOAM ABS GEL 100 (HEMOSTASIS) ×1 IMPLANT
SPONGE T-LAP 4X18 ~~LOC~~+RFID (SPONGE) ×2 IMPLANT
SURGIFLO W/THROMBIN 8M KIT (HEMOSTASIS) IMPLANT
SUT BONE WAX W31G (SUTURE) ×1 IMPLANT
SUT MNCRL AB 3-0 PS2 27 (SUTURE) ×1 IMPLANT
SUT SILK 2-0 18XBRD TIE 12 (SUTURE) IMPLANT
SUT VIC AB 2-0 CT1 18 (SUTURE) ×1 IMPLANT
SYR BULB IRRIG 60ML STRL (SYRINGE) ×1 IMPLANT
SYR CONTROL 10ML LL (SYRINGE) ×1 IMPLANT
TAPE CLOTH 4X10 WHT NS (GAUZE/BANDAGES/DRESSINGS) ×1 IMPLANT
TAPE UMBILICAL 1/8X30 (MISCELLANEOUS) ×1 IMPLANT
TOWEL GREEN STERILE (TOWEL DISPOSABLE) ×1 IMPLANT
TOWEL GREEN STERILE FF (TOWEL DISPOSABLE) ×1 IMPLANT

## 2024-03-23 NOTE — Brief Op Note (Signed)
 03/23/2024  10:46 AM  PATIENT:  Dana Owens  73 y.o. female  PRE-OPERATIVE DIAGNOSIS:  cervical spondylosis with radiculopathy  POST-OPERATIVE DIAGNOSIS:  cervical spondylosis with radiculopathy  PROCEDURE:  Procedure(s) with comments: ANTERIOR CERVICAL DISCECTOMY/DECOMPRESSION FUSION OF CERVICAL FIVE TO CERVICAL SIX AND CERVICAL SIX TO CERVICAL SEVEN (N/A) - Anterior Cervical discectomy with fusion C5-7  SURGEON:  Surgeons and Role:    DEWAINE Burnetta Aures, MD - Primary  PHYSICIAN ASSISTANT: Jeoffrey Sages, PA  ANESTHESIA:   general  EBL:  30 mL   BLOOD ADMINISTERED:none  DRAINS: none   LOCAL MEDICATIONS USED:  MARCAINE      SPECIMEN:  No Specimen  DISPOSITION OF SPECIMEN:  N/A  COUNTS:  YES  TOURNIQUET:  * No tourniquets in log *  DICTATION: .Dragon Dictation  PLAN OF CARE: Admit for overnight observation  PATIENT DISPOSITION:  PACU - hemodynamically stable.

## 2024-03-23 NOTE — Anesthesia Postprocedure Evaluation (Signed)
 Anesthesia Post Note  Patient: Dana Owens  Procedure(s) Performed: ANTERIOR CERVICAL DISCECTOMY/DECOMPRESSION FUSION OF CERVICAL FIVE TO CERVICAL SIX AND CERVICAL SIX TO CERVICAL SEVEN (Spine Cervical)     Patient location during evaluation: PACU Anesthesia Type: General Level of consciousness: awake and alert, oriented and patient cooperative Pain management: pain level controlled Vital Signs Assessment: post-procedure vital signs reviewed and stable Respiratory status: spontaneous breathing, nonlabored ventilation and respiratory function stable Cardiovascular status: blood pressure returned to baseline and stable Postop Assessment: no apparent nausea or vomiting Anesthetic complications: no   No notable events documented.  Last Vitals:  Vitals:   03/23/24 1300 03/23/24 1326  BP: 110/69 120/64  Pulse: 68 66  Resp: 20 16  Temp:  36.5 C  SpO2: 92% 95%    Last Pain:  Vitals:   03/23/24 1339  TempSrc:   PainSc: 0-No pain                 Almarie CHRISTELLA Marchi

## 2024-03-23 NOTE — Discharge Instructions (Signed)
 Today you will be discharged from the hospital.  The purpose of the following handout is to help guide you over the next 2 weeks.  First and foremost, be sure you have a follow up appointment with Dr. Shon Baton 2 weeks from the time of your surgery to have your sutures removed.  Please call [facility] [phone] to schedule or confirm this appointment.      Brace You do not have to wear the collar while lying in bed or sitting in a high-backed chair, eating, sleeping or showering.  Other than these instances, you must wear the brace.  You may NOT wear the collar while driving a vehicle (see driving restrictions below).  It is advisable that you wear the collar in public places or while traveling in a car as a passenger.  Dr. Shon Baton will discuss further use of the collar at your 2 week postop visit.  Wound Care You may SHOWER 5 days from the date of surgery.  Shower directly over the steri-strips.  DO NOT scrub or submerge (bath tub, swimming pool, hot tub, etc.) the area.  Pat to dry following your shower.  There is no need for additional dressings other than the steri-strips.  Allow the steri-strips to fall off on their own.  Once the strips have fallen off, you may leave the area undressed.  DO NOT apply lotion/cream/ointment to the area.  The wound must remain dry at all times other than while showering.  Dr. Shon Baton or his staff will remove your stiches at your first postop visit and give you additional instructions regarding wound care at that time.   Activity NO DRIVING FOR 2 WEEKS.  No lifting over 5 pounds (approximately a gallon of milk).  No bending, stooping, squatting or twisting.  No overhead activities.  We encourage you to walk (short distances and often throughout the day) as you can tolerate.  A good rule of thumb is to get up and move once or twice every hour.  You may go up and down stairs carefully.  As you continue to recover, Dr. Shon Baton will address and  adjust restrictions to your activities until no further restrictions are needed.  However, until your first postop visit, when Dr. Shon Baton can assess your recovery, you are to follow these instructions.  At the end of this document is a tentative outline of activities for up to 1 year.       Medication You will be discharged from the hospital with medication for pain, spasm, nausea and constipation.  You will be given enough medication to last until your first postop visit in 2 weeks.  Medications WILL NOT BE REFILLED EARLY; therefore, you are to take the medications only as directed.  If you have been given multiple prescriptions, please leave them with your pharmacy.  They can keep them on file for when you need them.  Medications that are lost or stolen WILL NOT be replaced.  We will address the need for continuing certain medications on an individual basis during your postop visit.  We ask that you avoid over the counter anti-inflammatory medications (Advil, Aleve, Motrin) for 3 months.    What you can expect following neck surgery... It is not uncommon to experience a sore throat or difficulty swallowing following neck surgery.  Cold liquids and soft foods are helpful in soothing this discomfort.  There is no specific diet that you are to follow after surgery, however, there are a  few things you should keep in mind to avoid unneeded discomfort.  Take small bites and eat slowly.  Chew your food thoroughly before swallowing.   It is not uncommon to experience incisional soreness or pain in the back of the neck, shoulders or between the shoulder blades.  These symptoms will slowly begin to resolve as you continue to recover, however, they can last for a few weeks.    It is not uncommon to experience INTERMITTENT arm pain following surgery.  This pain can mimic the arm pain you had prior to surgery.  As long as the pain resolves on its own and is not constant, there is no need to become alarmed.    When To Call If you experience fever >101F, loss of bowel or bladder control, painful swelling in the lower extremities, constant (unresolving) arm pain.  If you experience any of these symptoms, please call [facility] [phone].  What's Next As mentioned earlier, you will follow up with Dr. Shon Baton in 2 weeks.  At that time, we will likely remove your stitches and discuss additional aspects of your recovery.                   ACTIVITY GUIDELINES ANTERIOR CERVICAL DISECTOMY AND FUSION  Activity Discharge 2 weeks 6 weeks 3 months 6 months 1 year  Shower 5 days        Submerge the wound  no no yes     Walking outdoors yes       Lifting 5 lbs yes       Climbing stairs yes       Cooking yes       Car rides (less than 30 minutes) yes       Car rides (greater than 30 minutes) no varies yes     Air travel no varies yes     Short outings [employer], visits, etc...) yes       School no no yes     Driving a car no no varies yes    Light upper extremity exercises no no varies yes    Stationary bike no no yes     Swimming (no diving) no no no varies yes   Vacuuming, laundry, mopping no no no varies yes   Biking outdoors no no no no varies yes  Light jogging no no no varies yes   Low impact aerobics no no no varies yes   Non-contact sports (tennis, golf) no no no varies yes   Hunting (no tree climbing) no no no varies yes   Dancing (non-gymnastics) no no no varies yes   Down-hill skiing (experienced skier) no no no no yes   Down-hill skiing (novice) no no no no yes   Cross-country skiing no no no no yes   Horseback riding (noncompetitive)  no no no no yes   Horseback riding (competitive) no no no no varies yes  Gardening/landscaping no no no varies yes   House repairs no no no varies varies yes  Lifting up to 50 lbs no no no no varies yes

## 2024-03-23 NOTE — Transfer of Care (Signed)
 Immediate Anesthesia Transfer of Care Note  Patient: Dana Owens  Procedure(s) Performed: ANTERIOR CERVICAL DISCECTOMY/DECOMPRESSION FUSION OF CERVICAL FIVE TO CERVICAL SIX AND CERVICAL SIX TO CERVICAL SEVEN (Spine Cervical)  Patient Location: PACU  Anesthesia Type:General  Level of Consciousness: awake, alert , and oriented  Airway & Oxygen Therapy: Patient Spontanous Breathing and Patient connected to nasal cannula oxygen  Post-op Assessment: Report given to RN and Post -op Vital signs reviewed and stable  Post vital signs: Reviewed and stable  Last Vitals:  Vitals Value Taken Time  BP 135/67 03/23/24 10:53  Temp    Pulse 91 03/23/24 10:54  Resp 15 03/23/24 10:54  SpO2 94 % 03/23/24 10:54  Vitals shown include unfiled device data.  Last Pain:  Vitals:   03/23/24 0611  TempSrc:   PainSc: 0-No pain      Patients Stated Pain Goal: 0 (03/23/24 0601)  Complications: No notable events documented.

## 2024-03-23 NOTE — Anesthesia Preprocedure Evaluation (Addendum)
 Anesthesia Evaluation  Patient identified by MRN, date of birth, ID band Patient awake    Reviewed: Allergy & Precautions, NPO status , Patient's Chart, lab work & pertinent test results  History of Anesthesia Complications (+) history of anesthetic complications (SOB after thyroid  surgery)  Airway Mallampati: III  TM Distance: >3 FB Neck ROM: Full    Dental  (+) Teeth Intact, Dental Advisory Given   Pulmonary neg pulmonary ROS   Pulmonary exam normal breath sounds clear to auscultation       Cardiovascular negative cardio ROS Normal cardiovascular exam Rhythm:Regular Rate:Normal     Neuro/Psych  PSYCHIATRIC DISORDERS Anxiety        GI/Hepatic negative GI ROS, Neg liver ROS,,,  Endo/Other  Hypothyroidism  BMI 34  Renal/GU negative Renal ROS  negative genitourinary   Musculoskeletal  (+) Arthritis , Osteoarthritis,    Abdominal  (+) + obese  Peds  Hematology  (+) REFUSES BLOOD PRODUCTSHb 13, plt 222   Anesthesia Other Findings   Reproductive/Obstetrics negative OB ROS                              Anesthesia Physical Anesthesia Plan  ASA: 2  Anesthesia Plan: General   Post-op Pain Management: Tylenol  PO (pre-op)*   Induction: Intravenous  PONV Risk Score and Plan: 3 and Ondansetron , Dexamethasone  and Treatment may vary due to age or medical condition  Airway Management Planned: Oral ETT and Video Laryngoscope Planned  Additional Equipment: None  Intra-op Plan:   Post-operative Plan: Extubation in OR  Informed Consent: I have reviewed the patients History and Physical, chart, labs and discussed the procedure including the risks, benefits and alternatives for the proposed anesthesia with the patient or authorized representative who has indicated his/her understanding and acceptance.     Dental advisory given  Plan Discussed with: CRNA  Anesthesia Plan Comments:           Anesthesia Quick Evaluation

## 2024-03-23 NOTE — Progress Notes (Signed)
 Per pharmacist order for oral tablet Oxycodone  needs to be changed to oral solution Oxycodone  due to the patient's alpha-gal allergy. Ordered changed Dr. Burnetta aware.

## 2024-03-23 NOTE — Op Note (Signed)
 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  Left C6 and C7 radicular arm pain.  POST-OPERATIVE DIAGNOSIS: Same  PROCEDURE:   Anterior cervical discectomy and fusion C5-7  SURGEON:  Donaciano Sprang, MD  PHYSICIAN ASSISTANT: Jeoffrey Sages, PA  ANESTHESIA:   General  EBL: 30 ml   Complications: None  Implants: Titan endoskeleton intervertebral cage.  C5-6: 6 mm small lordotic.  C6-7: 7 mm small lordotic.  NuVasive ACP: 36 mm length.  15 mm locking screws at C5, 13 mm locking screws at C6 and C7.  Graft: Ossifuse and DBX  BRIEF HISTORY: Dana Owens is a 73 y.o. female who presented to my office with complaints of severe neck and radicular left arm pain.  Attempts at conservative management failed to alleviate her pain or improve her quality of life.  As a result we elected to move forward with surgery.  Risks, benefits, and alternatives to surgery were discussed and consent was obtained.  PROCEDURE DETAILS: Patient was brought into the operating room and was properly positioned on the operating room table.  After induction with general anesthesia the patient was endotracheally intubated.  A timeout was taken to confirm all important data: including patient, procedure, and the level. Teds, SCD's were applied.   The anterior cervical spine was positioned prepped and draped in a standard fashion.  Using fluoroscopy I marked out my incision site centered over the body of C6.  I infiltrated the incision site with quarter percent Marcaine  with epinephrine  and then made an transverse incision.  Sharp dissection was carried out down to and through the platysma.  I continued performing a standard Smith-Robinson approach dissecting in the avascular plane along the medial border of the sternocleidomastoid.  The omohyoid was identified and  isolated and retracted.  I then continued sharply dissecting through the remainder of the deep cervical and prevertebral fascia until I could see the anterior cervical spine.  I then placed a hand-held retractor to retract the esophagus and trachea to the right and I protected the carotid sheath with my finger laterally.  Using Kellogg I mobilized the remainder of the soft tissue to expose the entire surface of the anterior longitudinal ligament.  A needle was then placed into the C5-6 disc space and an x-ray was taken confirming that I was at the appropriate level  Using bipolar cautery I mobilized the longus coli muscle from the mid body of C5 to the mid body of C7.  This was done bilaterally.  I then resected the anterior projecting bone spur at from the disc space at C5-6 and C6-7.  The Caspar retractor blades were then placed in the wound and the endotracheal cuff was deflated.  I expanded the retractor to the appropriate width and then reinflated the endotracheal cuff.  An annulotomy at C6-7 was performed with a 15 blade scalpel and I used a combination of pituitary rongeurs, Kerrison rongeurs, and  curettes to remove the bulk of the disc material.  I then placed distraction pins into the midportion of the body of C6 and C7 and then distracted the intervertebral space with a lamina spreader and maintained with the distraction pin set.  I continue to remove the disc material posteriorly until I can see the posterior annulus.  Using a fine nerve hook I dissected through the posterior annulus and resected this with a  1 mm Kerrison rongeur.  I then dissected with a nerve hook through the posterior longitudinal ligament until I could create a plane between the PLL and the thecal sac.  I resected the PLL which then allowed me to undercut the uncovertebral joint further decompressing the nerve root.  At this point using fluoroscopy I confirmed had adequate decompression by passing my nerve hook behind the  vertebral bodies of C6 and C7 and under the uncovertebral joints bilaterally.  Also confirmed by parallel endplate distraction under live fluoroscopy.  At this point I rasped the endplates and then used the trialing devices to determine the appropriate size implant.  The 7 mm lordotic implant small was packed with the allograft and malleted to the appropriate depth.  Once it was properly positioned I then removed the distraction pin from C7 and sealed the hole with bone wax.  I then repositioned my retractors to expose the C5-6 disc space.  Using the exact same technique I performed a discectomy at this level.  I again resected the posterior longitudinal ligament with a 1 mm Kerrison rongeur and undercut the uncovertebral joints to further decompress the neural elements.  Once the decompression proved satisfactory I then confirmed with fluoroscopy that I can easily pass my nerve under the uncovertebral joints and under the vertebral bodies confirming satisfactory decompression.  The endplates were then rasped and I used the trialing devices.  The 6 mm small lordotic implant was chosen.  This was packed with graft and then malleted to the appropriate depth.  Imaging confirmed satisfactory positioning of both cages.  Distraction pins were removed and all bleeding bone edges were sealed with bone wax.  Wound was then copiously irrigated with normal saline and I confirmed hemostasis with bipolar electrocautery.  I then obtained the anterior cervical plate and contoured it.  DBX was placed anterior to the cage into the facilitated a sentinel fusion.  Cervical plate was then secured with the appropriate locking screws due to the vertebral bodies. All 6 screws had excellent purchase.  Once they were properly seated I then engaged the locking device to prevent backout.  Retractors were removed and final images were taken.  I demonstrated satisfactory overall cervical alignment and placement of the hardware in both planes.   After final irrigation the trachea and esophagus were returned to midline and the platysma was closed with interrupted 2-0 Vicryl sutures.  The skin was then closed with a running 3.0 Monocryl stitch.  Steri-Strips dry dressing and an Aspen collar was applied.  The patient was ultimately extubated and transferred the PACU then incident.  The end of the case all needle and sponge counts were correct.  First assistant was [employer], [place].  She was instrumental in assisting with retraction visualization, wound closure.  Donaciano Sprang, MD 03/23/2024 10:33 AM

## 2024-03-23 NOTE — Anesthesia Procedure Notes (Signed)
 Procedure Name: Intubation Date/Time: 03/23/2024 7:43 AM  Performed by: Elby Raelene SAUNDERS, CRNAPre-anesthesia Checklist: Patient identified, Emergency Drugs available, Suction available and Patient being monitored Patient Re-evaluated:Patient Re-evaluated prior to induction Oxygen Delivery Method: Circle System Utilized Preoxygenation: Pre-oxygenation with 100% oxygen Induction Type: IV induction Ventilation: Mask ventilation without difficulty Laryngoscope Size: Glidescope and 3 Grade View: Grade II Tube type: Oral Tube size: 7.0 mm Number of attempts: 1 Airway Equipment and Method: Stylet Placement Confirmation: ETT inserted through vocal cords under direct vision, positive ETCO2 and breath sounds checked- equal and bilateral Secured at: 21 cm Tube secured with: Tape Dental Injury: Teeth and Oropharynx as per pre-operative assessment

## 2024-03-23 NOTE — H&P (Signed)
 .   History: Dana Owens is a very pleasant 73 year old woman with progressive neck and radicular left arm pain. Clinical exam demonstrates progressive neurological deficits with weakness and dysesthesias in the C6 and C7 dermatome. She also has positive nerve root tension signs consistent with cervical radiculopathy. Imaging studies demonstrate severe foraminal stenosis C5-6 and C6-7 affecting the exiting C6 and C7 nerve roots respectively. She also has advanced degenerative disc disease and loss of normal cervical lordosis.  As a result we elected to move forward with surgery.  Past Medical History:  Diagnosis Date   Anxiety    Arthritis    Complication of anesthesia    SOB afterwards    Allergies  Allergen Reactions   Alpha-Gal Hives, Itching and Other (See Comments)    Cannot have mammal bone for grafts    No current facility-administered medications on file prior to encounter.   Current Outpatient Medications on File Prior to Encounter  Medication Sig Dispense Refill   carboxymethylcellulose 1 % ophthalmic solution Apply 1 drop to eye daily as needed (dry eyes).     EPINEPHrine  (EPIPEN  2-PAK) 0.3 mg/0.3 mL IJ SOAJ injection Inject 0.3 mg into the muscle as needed for anaphylaxis. 2 each 2   levothyroxine (SYNTHROID) 75 MCG tablet Take 75 mcg by mouth daily before breakfast.     sertraline (ZOLOFT) 50 MG tablet Take 25 mg by mouth daily.     desonide  (DESOWEN ) 0.05 % cream Apply topically 2 (two) times daily. (Patient taking differently: Apply 1 Application topically 2 (two) times daily as needed (allergic reaction).) 30 g 0   hydrOXYzine  (ATARAX ) 25 MG tablet Take 1 tablet (25 mg total) by mouth 3 (three) times daily as needed. (Patient not taking: Reported on 03/16/2024) 60 tablet 5   Multiple Vitamins-Minerals (PRESERVISION AREDS PO) Take 1 capsule by mouth in the morning and at bedtime.      Physical Exam: Vitals:   03/23/24 0602  BP: 136/69  Pulse: 71  Resp: 17  Temp: 98.1  F (36.7 C)  SpO2: 95%   Body mass index is 34.16 kg/m. Clinical exam: Dana Owens is a pleasant individual, who appears younger than their stated age.   She is alert and orientated 3.   No shortness of breath, chest pain.   Abdomen is soft and non-tender, negative loss of bowel and bladder control, no rebound tenderness.   Negative: skin lesions abrasions contusions  Peripheral pulses: 2+ peripheral pulses bilaterally.  LE compartments are: Soft and nontender.  Gait pattern: Normal  Assistive devices: None  Neuro: Right upper extremity: 5/5 deltoid/biceps/triceps/wrist extensors/grip strength. Left upper extremity: 5/5 deltoid/grip strength. 4/5 bicep/tricep/wrist extensor. Decreased sensation to light touch in the left C6 and C7 dermatome. Positive Spurling sign with reproduction of radicular left arm pain in the C6 and C7 dermatome. Negative Hoffman test. 1+ symmetrical deep tendon reflexes bilaterally in the upper extremity.  Musculoskeletal: Significant neck pain with crepitus. Pain is intensified with range of motion. Occasional occipital headaches. No significant shoulder, elbow, wrist pain with isolated joint range of motion.  Imaging: X-rays of the cervical spine demonstrate loss of normal cervical lordosis in the mid cervical spine. Advanced degenerative disc disease C5-6 with facet arthrosis C5-7.  Cervical MRI: completed on 02/15/2024. No cord signal changes. 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 Mild to moderate central stenosis C5-6 with severe left foraminal narrowing affecting the exiting C6 nerve root. There is cervical kyphosis at this level as well. Severe disc degeneration C6-7  with severe left foraminal stenosis affecting the exiting C7 nerve root. Severe right foraminal stenosis at C2-3. Minimal stenosis C3-5.  A/P: Dana Owens is a very pleasant 73 year old woman with progressive neck and radicular left arm pain. Clinical exam demonstrates  progressive neurological deficits with weakness and dysesthesias in the C6 and C7 dermatome. She also has positive nerve root tension signs consistent with cervical radiculopathy. Imaging studies demonstrate severe foraminal stenosis C5-6 and C6-7 affecting the exiting C6 and C7 nerve roots respectively. She also has advanced degenerative disc disease and loss of normal cervical lordosis. Collectively this will account for her severe neck and radicular arm pain. Given the progressive neurological deficits, motor and sensory testing along with the structural abnormality I think it is reasonable to consider surgery.  Plan surgical procedure would be a two-level ACDF C5-7. I have gone over the surgical procedure and also provided the patient with literature. All of her and her family's questions were addressed. She has expressed an understanding of the risks and benefits and has indicated willingness to move forward with surgery. Risks and benefits of surgery were discussed with the patient. These include: Infection, bleeding, death, stroke, paralysis, ongoing or worse pain, need for additional surgery, nonunion, leak of spinal fluid, adjacent segment degeneration requiring additional fusion surgery. Pseudoarthrosis (nonunion)requiring supplemental posterior fixation. Throat pain, swallowing difficulties, hoarseness or change in voice.

## 2024-03-24 ENCOUNTER — Encounter (HOSPITAL_COMMUNITY): Payer: Self-pay | Admitting: Orthopedic Surgery

## 2024-03-24 DIAGNOSIS — M50122 Cervical disc disorder at C5-C6 level with radiculopathy: Secondary | ICD-10-CM | POA: Diagnosis not present

## 2024-03-24 MED ORDER — OXYCODONE HCL 5 MG/5ML PO SOLN: 5.0000 mg | Freq: Four times a day (QID) | ORAL | 0 refills | Status: AC | PRN

## 2024-03-24 NOTE — Progress Notes (Signed)
 Patient alert and oriented, voided, ambulated. Surgical site clean and dry no sign of infection. D/c instructions explain and given to the patient all questions answered.

## 2024-03-24 NOTE — Discharge Summary (Signed)
 Patient ID: Dana Owens MRN: [id number] DOB/AGE: [birth year] 73 y.o.  Admit date: 03/23/2024 Discharge date: 03/24/2024  Admission Diagnoses:  Principal Problem:   Cervical disc herniation   Discharge Diagnoses:  Principal Problem:   Cervical disc herniation  status post Procedure(s): ANTERIOR CERVICAL DISCECTOMY/DECOMPRESSION FUSION OF CERVICAL FIVE TO CERVICAL SIX AND CERVICAL SIX TO CERVICAL SEVEN  Past Medical History:  Diagnosis Date   Anxiety    Arthritis    Complication of anesthesia    SOB afterwards    Surgeries: Procedure(s): ANTERIOR CERVICAL DISCECTOMY/DECOMPRESSION FUSION OF CERVICAL FIVE TO CERVICAL SIX AND CERVICAL SIX TO CERVICAL SEVEN on 03/23/2024   Consultants:   Discharged Condition: Improved  Hospital Course: Dana Owens is an 73 y.o. female who was admitted 03/23/2024 for operative treatment of Cervical disc herniation. Patient failed conservative treatments (please see the history and physical for the specifics) and had severe unremitting pain that affects sleep, daily activities and work/hobbies. After pre-op clearance, the patient was taken to the operating room on 03/23/2024 and underwent  Procedure(s): ANTERIOR CERVICAL DISCECTOMY/DECOMPRESSION FUSION OF CERVICAL FIVE TO CERVICAL SIX AND CERVICAL SIX TO CERVICAL SEVEN.    Patient was given perioperative antibiotics:  Anti-infectives (From admission, onward)    Start     Dose/Rate Route Frequency Ordered Stop   03/23/24 1600  ceFAZolin  (ANCEF ) IVPB 1 g/50 mL premix        1 g 100 mL/hr over 30 Minutes Intravenous Every 8 hours 03/23/24 1310 03/23/24 2342   03/23/24 0540  ceFAZolin  (ANCEF ) IVPB 2g/100 mL premix        2 g 200 mL/hr over 30 Minutes Intravenous 30 min pre-op 03/23/24 0540 03/23/24 0732        Patient was given sequential compression devices and early ambulation to prevent DVT.   Patient benefited maximally from hospital stay and there were no complications. At the time  of discharge, the patient was urinating/moving their bowels without difficulty, tolerating a regular diet, pain is controlled with oral pain medications and they have been cleared by PT/OT.   Recent vital signs: Patient Vitals for the past 24 hrs:  BP Temp Temp src Pulse Resp SpO2  03/24/24 0530 (!) 143/77 98.5 F (36.9 C) Oral 74 18 96 %  03/23/24 2305 128/65 98.5 F (36.9 C) Oral 95 16 95 %  03/23/24 1945 (!) 163/75 98.4 F (36.9 C) Oral 75 18 95 %  03/23/24 1708 (!) 122/51 98.1 F (36.7 C) Oral 72 16 96 %  03/23/24 1326 120/64 97.7 F (36.5 C) -- 66 16 95 %  03/23/24 1300 110/69 -- -- 68 20 92 %  03/23/24 1245 (!) 104/54 -- -- 64 16 93 %  03/23/24 1230 109/62 -- -- 65 15 93 %  03/23/24 1215 103/62 -- -- 69 (!) 21 100 %  03/23/24 1200 (!) 97/53 -- -- 63 (!) 9 97 %  03/23/24 1145 103/66 -- -- 75 19 97 %  03/23/24 1130 107/63 -- -- 69 13 95 %  03/23/24 1115 (!) 98/57 -- -- 70 11 93 %  03/23/24 1100 127/69 -- -- 79 12 94 %  03/23/24 1053 135/67 97.6 F (36.4 C) -- -- 15 94 %     Recent laboratory studies:  Recent Labs    03/22/24 1134  WBC 7.7  HGB 13.0  HCT 41.6  PLT 222     Discharge Medications:   Allergies as of 03/24/2024       Reactions   Alpha-gal  Hives, Itching, Other (See Comments)   Cannot have mammal bone for grafts        Medication List     STOP taking these medications    EPINEPHrine  0.3 mg/0.3 mL Soaj injection Commonly known as: EpiPen  2-Pak   hydrOXYzine  25 MG tablet Commonly known as: ATARAX    PRESERVISION AREDS PO       TAKE these medications    carboxymethylcellulose 1 % ophthalmic solution Apply 1 drop to eye daily as needed (dry eyes).   desonide  0.05 % cream Commonly known as: DesOwen  Apply topically 2 (two) times daily. What changed:  how much to take when to take this reasons to take this   levothyroxine 75 MCG tablet Commonly known as: SYNTHROID Take 75 mcg by mouth daily before breakfast.   methocarbamol  500 MG  tablet Commonly known as: ROBAXIN  Take 1 tablet (500 mg total) by mouth every 8 (eight) hours as needed for up to 5 days for muscle spasms.   ondansetron  4 MG tablet Commonly known as: Zofran  Take 1 tablet (4 mg total) by mouth every 8 (eight) hours as needed for nausea or vomiting.   oxyCODONE  5 MG/5ML solution Commonly known as: ROXICODONE  Take 5 mLs (5 mg total) by mouth every 6 (six) hours as needed for up to 5 days for severe pain (pain score 7-10).   sertraline 50 MG tablet Commonly known as: ZOLOFT Take 25 mg by mouth daily.        Diagnostic Studies: DG Cervical Spine 2 or 3 views Result Date: 03/23/2024 CLINICAL DATA:  Elective surgery. EXAM: CERVICAL SPINE - 2-3 VIEW COMPARISON:  None Available. FINDINGS: Three fluoroscopic spot views of the cervical spine submitted from the operating room. Anterior fusion hardware present from C5 through C7 with interbody spacers. Fluoroscopy time 41 seconds. Dose 3.22 mGy IMPRESSION: Intraoperative fluoroscopy during cervical fusion. Electronically Signed   By: Dana Owens M.D.   On: 03/23/2024 15:20   DG C-Arm 1-60 Min-No Report Result Date: 03/23/2024 Fluoroscopy was utilized by the requesting physician.  No radiographic interpretation.   DG C-Arm 1-60 Min-No Report Result Date: 03/23/2024 Fluoroscopy was utilized by the requesting physician.  No radiographic interpretation.   DG C-Arm 1-60 Min-No Report Result Date: 03/23/2024 Fluoroscopy was utilized by the requesting physician.  No radiographic interpretation.       Follow-up Information     Dana Aures, MD. Schedule an appointment as soon as possible for a visit in 2 week(s).   Specialty: Orthopedic Surgery Why: If symptoms worsen, For suture removal, For wound re-check Contact information: [address] [place] [address] [phone]                 Discharge Plan:  discharge to home today  Disposition:  Dana Owens is a very pleasant  73 year old female  who is POD1 from ACDF C5-C7. Surgical intervention was successful and without complications. Her hospital course has been uncomplicated. She states that her pre-operative arm pain is much improved since surgery. She is ambulating on her own. She is tolerating oral intake well. She is compliant with the ASPEN Collar. She is complaint with the incentive spirometer. She reports that her pain is well controled with oral pain medications. Dressing is c/d/I.  Positive void, positive flatus, positive BM. Patient will follow up with us  in clinic in 2 weeks. Patient understands that she cannot shower for the first 5 days post-op. All questions were welcomed and addressed.    Signed: Hospital doctor  LOISE Sages  PA-C for Dr. Donaciano Sprang [facility] [phone] 03/24/2024, 7:29 AM

## 2024-03-24 NOTE — Care Management Obs Status (Signed)
 MEDICARE OBSERVATION STATUS NOTIFICATION   Patient Details  Name: Dana Owens MRN: [id number] Date of Birth: 01-16-1951   Medicare Observation Status Notification Given:  Yes    Jon Cruel 03/24/2024, 9:49 AM

## 2024-03-24 NOTE — Evaluation (Signed)
 Occupational Therapy Evaluation Patient Details Name: Dana Owens MRN: [id number] DOB: 10-19-1950 Today's Date: 03/24/2024   History of Present Illness   73 yo F adm for ACDF.  EFY:[id number]       Arthritis     Clinical Impressions Patient admitted for the procedure above.  PTA patient remained active and independent.  Essentially at her baseline for all mobility and ADL completion.  No further needs in the acute setting.  Recommend follow up as prescribed by MD.       If plan is discharge home, recommend the following:   Assist for transportation     Functional Status Assessment   Patient has not had a recent decline in their functional status     Equipment Recommendations   None recommended by OT     Recommendations for Other Services         Precautions/Restrictions   Precautions Precautions: Cervical Precaution Booklet Issued: Yes (comment) Recall of Precautions/Restrictions: Intact Required Braces or Orthoses: Cervical Brace Cervical Brace: Hard collar;For comfort Restrictions Weight Bearing Restrictions Per Provider Order: No     Mobility Bed Mobility Overal bed mobility: Modified Independent                  Transfers Overall transfer level: Independent                        Balance Overall balance assessment: No apparent balance deficits (not formally assessed)                                         ADL either performed or assessed with clinical judgement   ADL Overall ADL's : Modified independent                                             Vision Patient Visual Report: No change from baseline       Perception Perception: Not tested       Praxis Praxis: Not tested       Pertinent Vitals/Pain Pain Assessment Pain Assessment: Faces Faces Pain Scale: Hurts a little bit Pain Location: Incision Pain Descriptors / Indicators: Aching Pain Intervention(s): Monitored during  session     Extremity/Trunk Assessment Upper Extremity Assessment Upper Extremity Assessment: Overall WFL for tasks assessed   Lower Extremity Assessment Lower Extremity Assessment: Overall WFL for tasks assessed   Cervical / Trunk Assessment Cervical / Trunk Assessment: Neck Surgery   Communication Communication Communication: No apparent difficulties   Cognition Arousal: Alert Behavior During Therapy: WFL for tasks assessed/performed Cognition: No apparent impairments                               Following commands: Intact       Cueing  General Comments   Cueing Techniques: Verbal cues   VSS   Exercises     Shoulder Instructions      Home Living Family/patient expects to be discharged to:: Private residence Living Arrangements: Spouse/significant other Available Help at Discharge: Family;Available 24 hours/day Type of Home: House Home Access: Level entry     Home Layout: One level     Bathroom Shower/Tub: Chief Strategy Officer: Standard  Home Equipment: None          Prior Functioning/Environment Prior Level of Function : Independent/Modified Independent;Driving                    OT Problem List: Pain   OT Treatment/Interventions:        OT Goals(Current goals can be found in the care plan section)   Acute Rehab OT Goals Patient Stated Goal: Return home OT Goal Formulation: With patient Time For Goal Achievement: 03/27/24 Potential to Achieve Goals: Good   OT Frequency:       Co-evaluation              AM-PAC OT 6 Clicks Daily Activity     Outcome Measure Help from another person eating meals?: None Help from another person taking care of personal grooming?: None Help from another person toileting, which includes using toliet, bedpan, or urinal?: None Help from another person bathing (including washing, rinsing, drying)?: None Help from another person to put on and taking off regular  upper body clothing?: None Help from another person to put on and taking off regular lower body clothing?: None 6 Click Score: 24   End of Session Equipment Utilized During Treatment: Cervical collar Nurse Communication: Mobility status  Activity Tolerance: Patient tolerated treatment well Patient left: in chair;with call bell/phone within reach  OT Visit Diagnosis: Pain                Time: 9084-9063 OT Time Calculation (min): 21 min Charges:  OT General Charges $OT Visit: 1 Visit OT Evaluation $OT Eval Moderate Complexity: 1 Mod  03/24/2024  RP, OTR/L  Acute Rehabilitation Services  Office:  [phone]   Dana Owens 03/24/2024, 9:39 AM

## 2024-05-24 ENCOUNTER — Ambulatory Visit: Payer: Medicare PPO | Admitting: Allergy & Immunology

## 2024-05-24 ENCOUNTER — Encounter: Payer: Self-pay | Admitting: Allergy & Immunology

## 2024-05-24 VITALS — BP 98/64 | HR 75 | Temp 97.7°F | Ht 64.75 in | Wt 202.0 lb

## 2024-05-24 DIAGNOSIS — L2389 Allergic contact dermatitis due to other agents: Secondary | ICD-10-CM | POA: Diagnosis not present

## 2024-05-24 DIAGNOSIS — L282 Other prurigo: Secondary | ICD-10-CM

## 2024-05-24 DIAGNOSIS — Z91018 Allergy to other foods: Secondary | ICD-10-CM

## 2024-05-24 MED ORDER — HYDROXYZINE HCL 25 MG PO TABS: 25.0000 mg | ORAL_TABLET | ORAL | 6 refills | Status: AC | PRN

## 2024-05-24 NOTE — Progress Notes (Signed)
 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 - We will recheck the alpha gal and see where the levels are trending.  - We will renew the EpiPen  at the next visit.    3. Return in about 6 months (around 11/21/2024). You can have the follow up appointment with Dr. Iva or a Nurse Practicioner (our Nurse Practitioners are excellent and always have Physician oversight!).   Subjective:   Dana Owens is a 73 y.o. female presenting today for follow up of  Chief Complaint  Patient presents with   Follow-up    Pt states overall her symptoms have well managed, she has not ad any flare ups.    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  Discussed the use of AI scribe software for clinical note transcription with the patient and/or guardian, who gave verbal consent to proceed.  Dana Owens is a 73 y.o. female presenting for a follow up visit.  She was last seen in June 2025.  At that time, we continued with hydroxyzine  at bedtime as needed for itching as well as desonide  up to twice daily as needed.  We did talk about patch testing.  She continue to avoid  alpha gal.  Her last labs were drawn December 2024.  They were still positive, but trending downward.  Since last visit, she has done well.  Food Allergy Symptom History: She continues to manage her alpha-gal syndrome by avoiding red meat and reports potential cross-contamination issues at restaurants due to changes in cooking practices. She primarily consumes chicken and has noticed an increase in eating out, which may have affected her condition.  Component     Latest Ref Rng 11/02/2022 05/21/2023 12/03/2023  IgE (Immunoglobulin E), Serum     6 - 495 IU/mL 87  109  117   [id number] Alpha-Gal     Class V kU/L 25.50 !  17.70 !  31.80 !   Beef IgE     Class V kU/L 18.90 !  15.60 !  29.30 !   Pork IgE     Class V kU/L 15.90 !  11.00 !  20.30 !   Allergen Lamb IgE     Class V kU/L 17.10 !  13.80 !  25.40 !      Skin Symptom History: She has not experienced any recent issues with her rash and has not had any breakouts since her initial visit. She has used only four hydroxyzine  pills in the past two years and did not refill her prescription last year. She is considering a refill now as the current prescription expires in May.  She underwent neck  surgery to remove two discs, which has resolved her pain. Prior to surgery, she experienced radiating pain down her arm, described as feeling like a blood pressure cuff tightening and releasing. The pain was alleviated by reclining in a chair at a certain angle. She is currently finishing up rehabilitation post-surgery.   Otherwise, there have been no changes to her past medical history, surgical history, family history, or social history.    Review of systems otherwise negative other than that mentioned in the HPI.    Objective:   Blood pressure 98/64, pulse 75, temperature 97.7 F (36.5 C), temperature source Temporal, height 5' 4.75 (1.645 m), weight 202 lb (91.6 kg), SpO2 97%. Body mass index is 33.87 kg/m.    Physical Exam Vitals  reviewed.  Constitutional:      Appearance: She is well-developed.     Comments: Friendly. Cooperative with the exam.   HENT:     Head: Normocephalic and atraumatic.     Right Ear: Tympanic membrane, ear canal and external ear normal. No drainage, swelling or tenderness. Tympanic membrane is not injected, scarred, erythematous, retracted or bulging.     Left Ear: Tympanic membrane, ear canal and external ear normal. No drainage, swelling or tenderness. Tympanic membrane is not injected, scarred, erythematous, retracted or bulging.     Nose: No nasal deformity, septal deviation, mucosal edema or rhinorrhea.     Right Turbinates: Enlarged, swollen and pale.     Left Turbinates: Enlarged, swollen and pale.     Right Sinus: No maxillary sinus tenderness or frontal sinus tenderness.     Left Sinus: No maxillary sinus tenderness or frontal sinus tenderness.     Comments: No polyps noted.     Mouth/Throat:     Lips: Pink.     Mouth: Mucous membranes are moist. Mucous membranes are not pale and not dry.     Pharynx: Uvula midline.  Eyes:     General: Lids are normal. Allergic shiner present.        Right eye: No discharge.        Left eye: No discharge.     Conjunctiva/sclera: Conjunctivae normal.     Right eye: Right conjunctiva is not injected. No chemosis.    Left eye: Left conjunctiva is not injected. No chemosis.    Pupils: Pupils are equal, round, and reactive to light.  Cardiovascular:     Rate and Rhythm: Normal rate and regular rhythm.     Heart sounds: Normal heart sounds.  Pulmonary:     Effort: Pulmonary effort is normal. No tachypnea, accessory muscle usage or respiratory distress.     Breath sounds: Normal breath sounds. No wheezing, rhonchi or rales.     Comments: Moving air well in all lung fields.  No increased work of breathing. Chest:     Chest wall: No tenderness.  Abdominal:     Tenderness: There is no abdominal tenderness. There is no guarding or rebound.   Lymphadenopathy:     Head:     Right side of head: No submandibular, tonsillar or occipital adenopathy.     Left side of head: No submandibular, tonsillar or occipital adenopathy.     Cervical: No cervical adenopathy.  Skin:    General: Skin is warm.     Capillary Refill: Capillary refill takes less than 2 seconds.     Coloration: Skin is not pale.     Findings: No abrasion, erythema, petechiae or rash. Rash is not papular, urticarial or vesicular.  Neurological:     Mental Status: She is alert.  Psychiatric:        Behavior: Behavior is cooperative.      Diagnostic studies: labs sent instead       Marty Shaggy, MD  [facility] 

## 2024-05-24 NOTE — Patient Instructions (Addendum)
 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 - We will recheck the alpha gal and see where the levels are trending.  - We will renew the EpiPen  at the next visit.    3. Return in about 6 months (around 11/21/2024). You can have the follow up appointment with Dr. Iva or a Nurse Practicioner (our Nurse Practitioners are excellent and always have Physician oversight!).    Please inform us  of any Emergency Department visits, hospitalizations, or changes in symptoms. Call us  before going to the ED for breathing or allergy symptoms since we might be able to fit you in for a sick visit. Feel free to contact us  anytime with any questions, problems, or concerns.  It was a pleasure to see you again today!  Websites that have reliable patient information: 1. American Academy of Asthma, Allergy, and Immunology: www.aaaai.org 2. Food Allergy Research and Education (FARE): foodallergy.org 3. Mothers of Asthmatics: http://www.asthmacommunitynetwork.org 4. American College of Allergy, Asthma, and Immunology: www.acaai.org      "Like" us  on Facebook and Instagram for our latest updates!      A healthy democracy works best when [employer] participate! Make sure you are registered to vote! If you have moved or changed any of your contact information, you will need to get this updated before voting! Scan the QR codes below to learn more!

## 2024-05-28 LAB — ALPHA-GAL PANEL
Allergen Lamb IgE: 27.4 kU/L — AB
Beef IgE: 27.3 kU/L — AB
IgE (Immunoglobulin E), Serum: 100 [IU]/mL (ref 6–495)
O215-IgE Alpha-Gal: 34.2 kU/L — AB
Pork IgE: 23.8 kU/L — AB

## 2024-06-07 ENCOUNTER — Ambulatory Visit: Payer: Self-pay | Admitting: Allergy & Immunology

## 2024-11-29 ENCOUNTER — Ambulatory Visit: Admitting: Allergy & Immunology
